# Patient Record
Sex: Male | Born: 1935 | Race: White | Hispanic: No | Marital: Married
Health system: Southern US, Community
[De-identification: ages and names within clinical notes are randomized; demographics above are authoritative.]

---

## 2005-08-14 ENCOUNTER — Ambulatory Visit: Payer: Self-pay | Admitting: Internal Medicine

## 2009-03-08 ENCOUNTER — Ambulatory Visit: Payer: Self-pay | Admitting: Internal Medicine

## 2009-03-28 ENCOUNTER — Ambulatory Visit: Payer: Self-pay | Admitting: Pain Medicine

## 2009-04-19 ENCOUNTER — Ambulatory Visit: Payer: Self-pay | Admitting: Pain Medicine

## 2009-05-03 ENCOUNTER — Ambulatory Visit: Payer: Self-pay | Admitting: Physician Assistant

## 2011-03-20 ENCOUNTER — Ambulatory Visit: Payer: Self-pay | Admitting: Ophthalmology

## 2011-04-03 ENCOUNTER — Ambulatory Visit: Payer: Self-pay | Admitting: Ophthalmology

## 2011-06-18 ENCOUNTER — Ambulatory Visit: Payer: Self-pay | Admitting: Internal Medicine

## 2011-06-22 ENCOUNTER — Ambulatory Visit: Payer: Self-pay | Admitting: Specialist

## 2011-06-28 ENCOUNTER — Ambulatory Visit: Payer: Self-pay | Admitting: Internal Medicine

## 2011-07-03 ENCOUNTER — Ambulatory Visit: Payer: Self-pay | Admitting: Internal Medicine

## 2011-07-05 ENCOUNTER — Ambulatory Visit: Payer: Self-pay | Admitting: Specialist

## 2011-07-28 ENCOUNTER — Ambulatory Visit: Payer: Self-pay | Admitting: Internal Medicine

## 2011-08-17 ENCOUNTER — Ambulatory Visit: Payer: Self-pay | Admitting: Specialist

## 2011-08-30 ENCOUNTER — Ambulatory Visit: Payer: Self-pay | Admitting: Specialist

## 2011-08-30 LAB — APTT: Activated PTT: 32.5 secs (ref 23.6–35.9)

## 2011-08-30 LAB — PLATELET COUNT: Platelet: 247 10*3/uL (ref 150–440)

## 2011-09-05 ENCOUNTER — Ambulatory Visit: Payer: Self-pay | Admitting: Internal Medicine

## 2011-09-28 ENCOUNTER — Ambulatory Visit: Payer: Self-pay | Admitting: Internal Medicine

## 2011-10-01 LAB — CBC CANCER CENTER
Basophil #: 0 x10 3/mm (ref 0.0–0.1)
Basophil %: 0.3 %
Eosinophil #: 0.2 x10 3/mm (ref 0.0–0.7)
Eosinophil %: 3.4 %
HGB: 11.9 g/dL — ABNORMAL LOW (ref 13.0–18.0)
Lymphocyte #: 0.8 x10 3/mm — ABNORMAL LOW (ref 1.0–3.6)
MCH: 27.8 pg (ref 26.0–34.0)
MCHC: 32.9 g/dL (ref 32.0–36.0)
MCV: 84 fL (ref 80–100)
Monocyte #: 0.8 x10 3/mm — ABNORMAL HIGH (ref 0.0–0.7)
Monocyte %: 13.9 %
Neutrophil #: 3.8 x10 3/mm (ref 1.4–6.5)
Neutrophil %: 68.8 %
Platelet: 317 x10 3/mm (ref 150–440)
RBC: 4.27 10*6/uL — ABNORMAL LOW (ref 4.40–5.90)
WBC: 5.6 x10 3/mm (ref 3.8–10.6)

## 2011-10-08 LAB — CBC CANCER CENTER
Basophil %: 0.3 %
Eosinophil %: 2.1 %
HCT: 36.3 % — ABNORMAL LOW (ref 40.0–52.0)
Lymphocyte %: 8 %
MCH: 28 pg (ref 26.0–34.0)
MCV: 84 fL (ref 80–100)
Monocyte #: 0.7 x10 3/mm (ref 0.0–0.7)
Neutrophil #: 4.8 x10 3/mm (ref 1.4–6.5)
Neutrophil %: 77.6 %
Platelet: 292 x10 3/mm (ref 150–440)
RBC: 4.34 10*6/uL — ABNORMAL LOW (ref 4.40–5.90)
WBC: 6.2 x10 3/mm (ref 3.8–10.6)

## 2011-10-22 LAB — CBC CANCER CENTER
Basophil #: 0 x10 3/mm (ref 0.0–0.1)
Eosinophil #: 0.1 x10 3/mm (ref 0.0–0.7)
Eosinophil %: 2.4 %
HCT: 36.4 % — ABNORMAL LOW (ref 40.0–52.0)
HGB: 12 g/dL — ABNORMAL LOW (ref 13.0–18.0)
Lymphocyte #: 0.3 x10 3/mm — ABNORMAL LOW (ref 1.0–3.6)
Lymphocyte %: 4.9 %
MCH: 27.9 pg (ref 26.0–34.0)
MCV: 85 fL (ref 80–100)
Monocyte #: 0.8 x10 3/mm — ABNORMAL HIGH (ref 0.0–0.7)
Neutrophil %: 79 %
Platelet: 259 x10 3/mm (ref 150–440)
RBC: 4.29 10*6/uL — ABNORMAL LOW (ref 4.40–5.90)
WBC: 5.7 x10 3/mm (ref 3.8–10.6)

## 2011-10-26 ENCOUNTER — Ambulatory Visit: Payer: Self-pay | Admitting: Internal Medicine

## 2011-10-29 LAB — CBC CANCER CENTER
Basophil #: 0 x10 3/mm (ref 0.0–0.1)
Eosinophil #: 0.3 x10 3/mm (ref 0.0–0.7)
Eosinophil %: 5.9 %
Lymphocyte #: 0.3 x10 3/mm — ABNORMAL LOW (ref 1.0–3.6)
Lymphocyte %: 5.2 %
MCH: 27.7 pg (ref 26.0–34.0)
MCHC: 32.3 g/dL (ref 32.0–36.0)
MCV: 86 fL (ref 80–100)
Monocyte %: 16.1 %
Neutrophil #: 3.6 x10 3/mm (ref 1.4–6.5)
Neutrophil %: 72.3 %
Platelet: 227 x10 3/mm (ref 150–440)
RBC: 4.29 10*6/uL — ABNORMAL LOW (ref 4.40–5.90)
RDW: 16.2 % — ABNORMAL HIGH (ref 11.5–14.5)

## 2011-11-05 LAB — CBC CANCER CENTER
Basophil #: 0 x10 3/mm (ref 0.0–0.1)
Eosinophil #: 0.2 x10 3/mm (ref 0.0–0.7)
Lymphocyte %: 5.2 %
MCH: 28.5 pg (ref 26.0–34.0)
MCHC: 33.3 g/dL (ref 32.0–36.0)
Neutrophil #: 2.6 x10 3/mm (ref 1.4–6.5)
Neutrophil %: 68.4 %
Platelet: 221 x10 3/mm (ref 150–440)
RDW: 16.5 % — ABNORMAL HIGH (ref 11.5–14.5)
WBC: 3.8 x10 3/mm (ref 3.8–10.6)

## 2011-11-26 ENCOUNTER — Ambulatory Visit: Payer: Self-pay | Admitting: Internal Medicine

## 2011-12-17 LAB — BASIC METABOLIC PANEL
Anion Gap: 7 (ref 7–16)
Calcium, Total: 9 mg/dL (ref 8.5–10.1)
Co2: 32 mmol/L (ref 21–32)
EGFR (African American): >60
EGFR (Non-African Amer.): 59 — ABNORMAL LOW
Glucose: 200 mg/dL — ABNORMAL HIGH (ref 65–99)
Osmolality: 285 (ref 275–301)
Potassium: 4.5 mmol/L (ref 3.5–5.1)

## 2011-12-17 LAB — CBC CANCER CENTER
Basophil #: 0.1 x10 3/mm (ref 0.0–0.1)
Basophil %: 0.5 %
Eosinophil #: 0 x10 3/mm (ref 0.0–0.7)
Eosinophil %: 0.2 %
Lymphocyte %: 0.8 %
MCH: 27.4 pg (ref 26.0–34.0)
MCHC: 31.7 g/dL — ABNORMAL LOW (ref 32.0–36.0)
Monocyte #: 0.4 x10 3/mm (ref 0.2–1.0)
Monocyte %: 4.4 %
Neutrophil #: 9.4 x10 3/mm — ABNORMAL HIGH (ref 1.4–6.5)
Neutrophil %: 94.1 %
RBC: 4.78 10*6/uL (ref 4.40–5.90)
RDW: 16.3 % — ABNORMAL HIGH (ref 11.5–14.5)

## 2011-12-17 LAB — HEPATIC FUNCTION PANEL A (ARMC)
Alkaline Phosphatase: 85 U/L (ref 50–136)
Bilirubin, Direct: 0.2 mg/dL (ref 0.00–0.20)
Bilirubin,Total: 0.6 mg/dL (ref 0.2–1.0)
SGOT(AST): 22 U/L (ref 15–37)
Total Protein: 6.8 g/dL (ref 6.4–8.2)

## 2011-12-25 ENCOUNTER — Inpatient Hospital Stay: Payer: Self-pay | Admitting: Internal Medicine

## 2011-12-25 LAB — CBC WITH DIFFERENTIAL/PLATELET
Basophil #: 0 10*3/uL (ref 0.0–0.1)
Eosinophil #: 0 10*3/uL (ref 0.0–0.7)
HGB: 13.1 g/dL (ref 13.0–18.0)
Lymphocyte #: 0.2 10*3/uL — ABNORMAL LOW (ref 1.0–3.6)
MCH: 28.4 pg (ref 26.0–34.0)
MCHC: 33 g/dL (ref 32.0–36.0)
MCV: 86 fL (ref 80–100)
Monocyte #: 0.4 x10 3/mm (ref 0.2–1.0)
Monocyte %: 4.4 %
Neutrophil #: 8.8 10*3/uL — ABNORMAL HIGH (ref 1.4–6.5)
Platelet: 164 10*3/uL (ref 150–440)
RDW: 16.8 % — ABNORMAL HIGH (ref 11.5–14.5)

## 2011-12-25 LAB — COMPREHENSIVE METABOLIC PANEL
Albumin: 3 g/dL — ABNORMAL LOW (ref 3.4–5.0)
Alkaline Phosphatase: 66 U/L (ref 50–136)
Anion Gap: 3 — ABNORMAL LOW (ref 7–16)
Bilirubin,Total: 0.6 mg/dL (ref 0.2–1.0)
Co2: 34 mmol/L — ABNORMAL HIGH (ref 21–32)
EGFR (African American): >60
EGFR (Non-African Amer.): 57 — ABNORMAL LOW
Glucose: 138 mg/dL — ABNORMAL HIGH (ref 65–99)
Sodium: 138 mmol/L (ref 136–145)
Total Protein: 6.6 g/dL (ref 6.4–8.2)

## 2011-12-26 ENCOUNTER — Ambulatory Visit: Payer: Self-pay | Admitting: Internal Medicine

## 2011-12-26 LAB — BASIC METABOLIC PANEL
BUN: 29 mg/dL — ABNORMAL HIGH (ref 7–18)
Creatinine: 1 mg/dL (ref 0.60–1.30)
EGFR (African American): >60
EGFR (Non-African Amer.): >60
Glucose: 187 mg/dL — ABNORMAL HIGH (ref 65–99)
Osmolality: 285 (ref 275–301)
Potassium: 4.1 mmol/L (ref 3.5–5.1)
Sodium: 137 mmol/L (ref 136–145)

## 2011-12-26 LAB — CBC WITH DIFFERENTIAL/PLATELET
Basophil #: 0 10*3/uL (ref 0.0–0.1)
Basophil %: 0.2 %
Eosinophil #: 0 10*3/uL (ref 0.0–0.7)
Eosinophil %: 0 %
HCT: 37.4 % — ABNORMAL LOW (ref 40.0–52.0)
HGB: 12.4 g/dL — ABNORMAL LOW (ref 13.0–18.0)
Lymphocyte #: 0.1 10*3/uL — ABNORMAL LOW (ref 1.0–3.6)
Lymphocyte %: 0.9 %
MCH: 28.1 pg (ref 26.0–34.0)
MCHC: 33 g/dL (ref 32.0–36.0)
MCV: 85 fL (ref 80–100)
Neutrophil #: 8.2 10*3/uL — ABNORMAL HIGH (ref 1.4–6.5)
Neutrophil %: 97.5 %
Platelet: 154 10*3/uL (ref 150–440)
RBC: 4.4 10*6/uL (ref 4.40–5.90)
RDW: 16.6 % — ABNORMAL HIGH (ref 11.5–14.5)

## 2011-12-26 LAB — TROPONIN I: Troponin-I: <0.02 ng/mL

## 2011-12-28 LAB — VANCOMYCIN, TROUGH: Vancomycin, Trough: 14 ug/mL (ref 10–20)

## 2011-12-29 LAB — CBC WITH DIFFERENTIAL/PLATELET
Basophil %: 0.2 %
Eosinophil #: 0 10*3/uL (ref 0.0–0.7)
HCT: 36.2 % — ABNORMAL LOW (ref 40.0–52.0)
Lymphocyte #: 0.1 10*3/uL — ABNORMAL LOW (ref 1.0–3.6)
MCHC: 33.3 g/dL (ref 32.0–36.0)
MCV: 85 fL (ref 80–100)
Monocyte #: 0.4 x10 3/mm (ref 0.2–1.0)
Monocyte %: 3.9 %
Neutrophil #: 11 10*3/uL — ABNORMAL HIGH (ref 1.4–6.5)
Neutrophil %: 95.3 %
Platelet: 174 10*3/uL (ref 150–440)
RDW: 16.3 % — ABNORMAL HIGH (ref 11.5–14.5)
WBC: 11.5 10*3/uL — ABNORMAL HIGH (ref 3.8–10.6)

## 2011-12-29 LAB — COMPREHENSIVE METABOLIC PANEL
Anion Gap: 6 — ABNORMAL LOW (ref 7–16)
BUN: 32 mg/dL — ABNORMAL HIGH (ref 7–18)
Calcium, Total: 8 mg/dL — ABNORMAL LOW (ref 8.5–10.1)
Chloride: 99 mmol/L (ref 98–107)
Co2: 35 mmol/L — ABNORMAL HIGH (ref 21–32)
Creatinine: 1.02 mg/dL (ref 0.60–1.30)
Glucose: 195 mg/dL — ABNORMAL HIGH (ref 65–99)
SGPT (ALT): 65 U/L
Sodium: 140 mmol/L (ref 136–145)
Total Protein: 5.7 g/dL — ABNORMAL LOW (ref 6.4–8.2)

## 2011-12-31 LAB — CBC WITH DIFFERENTIAL/PLATELET
Basophil #: 0 10*3/uL (ref 0.0–0.1)
Eosinophil %: 0 %
HGB: 11.4 g/dL — ABNORMAL LOW (ref 13.0–18.0)
Lymphocyte #: 0 10*3/uL — ABNORMAL LOW (ref 1.0–3.6)
Lymphocyte %: 0.6 %
MCH: 28.2 pg (ref 26.0–34.0)
Monocyte #: 0.3 x10 3/mm (ref 0.2–1.0)
Monocyte %: 4.4 %
Neutrophil #: 6.9 10*3/uL — ABNORMAL HIGH (ref 1.4–6.5)
Platelet: 139 10*3/uL — ABNORMAL LOW (ref 150–440)
RBC: 4.05 10*6/uL — ABNORMAL LOW (ref 4.40–5.90)
RDW: 16.1 % — ABNORMAL HIGH (ref 11.5–14.5)
WBC: 7.3 10*3/uL (ref 3.8–10.6)

## 2011-12-31 LAB — COMPREHENSIVE METABOLIC PANEL
Albumin: 2.3 g/dL — ABNORMAL LOW (ref 3.4–5.0)
Alkaline Phosphatase: 71 U/L (ref 50–136)
Anion Gap: 7 (ref 7–16)
BUN: 30 mg/dL — ABNORMAL HIGH (ref 7–18)
Bilirubin,Total: 0.4 mg/dL (ref 0.2–1.0)
Co2: 35 mmol/L — ABNORMAL HIGH (ref 21–32)
Creatinine: 1.12 mg/dL (ref 0.60–1.30)
Glucose: 301 mg/dL — ABNORMAL HIGH (ref 65–99)
Potassium: 3.9 mmol/L (ref 3.5–5.1)
SGPT (ALT): 108 U/L — ABNORMAL HIGH
Total Protein: 5.2 g/dL — ABNORMAL LOW (ref 6.4–8.2)

## 2011-12-31 LAB — VANCOMYCIN, TROUGH: Vancomycin, Trough: 12 ug/mL (ref 10–20)

## 2012-01-01 LAB — CBC WITH DIFFERENTIAL/PLATELET
Basophil #: 0 10*3/uL (ref 0.0–0.1)
HCT: 35.9 % — ABNORMAL LOW (ref 40.0–52.0)
Lymphocyte #: 0 10*3/uL — ABNORMAL LOW (ref 1.0–3.6)
Lymphocyte %: 0.5 %
MCH: 28.1 pg (ref 26.0–34.0)
MCHC: 32.8 g/dL (ref 32.0–36.0)
Monocyte %: 3.7 %
Neutrophil #: 8.6 10*3/uL — ABNORMAL HIGH (ref 1.4–6.5)
Neutrophil %: 95.4 %
Platelet: 144 10*3/uL — ABNORMAL LOW (ref 150–440)
WBC: 9 10*3/uL (ref 3.8–10.6)

## 2012-01-01 LAB — COMPREHENSIVE METABOLIC PANEL
Albumin: 2.4 g/dL — ABNORMAL LOW (ref 3.4–5.0)
Alkaline Phosphatase: 65 U/L (ref 50–136)
BUN: 28 mg/dL — ABNORMAL HIGH (ref 7–18)
Bilirubin,Total: 0.4 mg/dL (ref 0.2–1.0)
Calcium, Total: 8 mg/dL — ABNORMAL LOW (ref 8.5–10.1)
Chloride: 98 mmol/L (ref 98–107)
Co2: 37 mmol/L — ABNORMAL HIGH (ref 21–32)
Creatinine: 0.89 mg/dL (ref 0.60–1.30)
EGFR (African American): >60
EGFR (Non-African Amer.): >60
Glucose: 249 mg/dL — ABNORMAL HIGH (ref 65–99)
Osmolality: 293 (ref 275–301)
Potassium: 3.9 mmol/L (ref 3.5–5.1)
SGOT(AST): 55 U/L — ABNORMAL HIGH (ref 15–37)
SGPT (ALT): 136 U/L — ABNORMAL HIGH
Sodium: 140 mmol/L (ref 136–145)
Total Protein: 5.3 g/dL — ABNORMAL LOW (ref 6.4–8.2)

## 2012-01-02 LAB — COMPREHENSIVE METABOLIC PANEL
Albumin: 2.4 g/dL — ABNORMAL LOW (ref 3.4–5.0)
Alkaline Phosphatase: 76 U/L (ref 50–136)
Calcium, Total: 7.9 mg/dL — ABNORMAL LOW (ref 8.5–10.1)
Co2: 37 mmol/L — ABNORMAL HIGH (ref 21–32)
Creatinine: 1.01 mg/dL (ref 0.60–1.30)
Glucose: 361 mg/dL — ABNORMAL HIGH (ref 65–99)
SGOT(AST): 58 U/L — ABNORMAL HIGH (ref 15–37)
Sodium: 139 mmol/L (ref 136–145)

## 2012-01-02 LAB — CBC WITH DIFFERENTIAL/PLATELET
Basophil #: 0 10*3/uL (ref 0.0–0.1)
Basophil %: 0.1 %
Eosinophil %: 0 %
HCT: 35.5 % — ABNORMAL LOW (ref 40.0–52.0)
HGB: 11.7 g/dL — ABNORMAL LOW (ref 13.0–18.0)
Lymphocyte #: 0 10*3/uL — ABNORMAL LOW (ref 1.0–3.6)
Lymphocyte %: 0.5 %
MCHC: 32.9 g/dL (ref 32.0–36.0)
Monocyte #: 0.3 x10 3/mm (ref 0.2–1.0)
Neutrophil #: 7.2 10*3/uL — ABNORMAL HIGH (ref 1.4–6.5)
Neutrophil %: 95.7 %
RBC: 4.11 10*6/uL — ABNORMAL LOW (ref 4.40–5.90)
RDW: 16.5 % — ABNORMAL HIGH (ref 11.5–14.5)
WBC: 7.5 10*3/uL (ref 3.8–10.6)

## 2012-01-02 LAB — VANCOMYCIN, TROUGH: Vancomycin, Trough: 19 ug/mL (ref 10–20)

## 2012-01-03 LAB — CBC WITH DIFFERENTIAL/PLATELET
Basophil #: 0 10*3/uL (ref 0.0–0.1)
Basophil %: 0.1 %
Eosinophil #: 0 10*3/uL (ref 0.0–0.7)
HCT: 37.6 % — ABNORMAL LOW (ref 40.0–52.0)
Lymphocyte #: 0 10*3/uL — ABNORMAL LOW (ref 1.0–3.6)
MCH: 28.5 pg (ref 26.0–34.0)
MCHC: 32.9 g/dL (ref 32.0–36.0)
MCV: 87 fL (ref 80–100)
Monocyte #: 0.3 x10 3/mm (ref 0.2–1.0)
Platelet: 144 10*3/uL — ABNORMAL LOW (ref 150–440)
RBC: 4.34 10*6/uL — ABNORMAL LOW (ref 4.40–5.90)
RDW: 16.2 % — ABNORMAL HIGH (ref 11.5–14.5)

## 2012-01-03 LAB — COMPREHENSIVE METABOLIC PANEL
Albumin: 2.7 g/dL — ABNORMAL LOW (ref 3.4–5.0)
Anion Gap: 5 — ABNORMAL LOW (ref 7–16)
BUN: 30 mg/dL — ABNORMAL HIGH (ref 7–18)
Bilirubin,Total: 0.5 mg/dL (ref 0.2–1.0)
Chloride: 94 mmol/L — ABNORMAL LOW (ref 98–107)
Co2: 38 mmol/L — ABNORMAL HIGH (ref 21–32)
EGFR (African American): >60
EGFR (Non-African Amer.): >60
Glucose: 391 mg/dL — ABNORMAL HIGH (ref 65–99)
SGOT(AST): 51 U/L — ABNORMAL HIGH (ref 15–37)
SGPT (ALT): 169 U/L — ABNORMAL HIGH
Sodium: 137 mmol/L (ref 136–145)
Total Protein: 5.8 g/dL — ABNORMAL LOW (ref 6.4–8.2)

## 2012-01-04 LAB — COMPREHENSIVE METABOLIC PANEL
Alkaline Phosphatase: 84 U/L (ref 50–136)
Anion Gap: 7 (ref 7–16)
BUN: 35 mg/dL — ABNORMAL HIGH (ref 7–18)
Chloride: 95 mmol/L — ABNORMAL LOW (ref 98–107)
Creatinine: 1.03 mg/dL (ref 0.60–1.30)
EGFR (African American): >60
EGFR (Non-African Amer.): >60
Glucose: 394 mg/dL — ABNORMAL HIGH (ref 65–99)
SGOT(AST): 43 U/L — ABNORMAL HIGH (ref 15–37)
SGPT (ALT): 176 U/L — ABNORMAL HIGH
Total Protein: 5.9 g/dL — ABNORMAL LOW (ref 6.4–8.2)

## 2012-01-04 LAB — CBC WITH DIFFERENTIAL/PLATELET
Basophil #: 0 10*3/uL (ref 0.0–0.1)
Basophil %: 0.2 %
Eosinophil #: 0 10*3/uL (ref 0.0–0.7)
Eosinophil %: 0 %
Lymphocyte #: 0 10*3/uL — ABNORMAL LOW (ref 1.0–3.6)
Monocyte #: 0.4 x10 3/mm (ref 0.2–1.0)
Monocyte %: 3.5 %
Neutrophil #: 9.6 10*3/uL — ABNORMAL HIGH (ref 1.4–6.5)
Neutrophil %: 96 %
Platelet: 160 10*3/uL (ref 150–440)
RDW: 16.6 % — ABNORMAL HIGH (ref 11.5–14.5)
WBC: 10 10*3/uL (ref 3.8–10.6)

## 2012-01-04 LAB — LACTATE DEHYDROGENASE: LDH: 484 U/L — ABNORMAL HIGH (ref 87–241)

## 2012-01-05 LAB — CBC WITH DIFFERENTIAL/PLATELET
Basophil #: 0 10*3/uL (ref 0.0–0.1)
Basophil %: 0.1 %
Eosinophil #: 0 10*3/uL (ref 0.0–0.7)
HCT: 37.1 % — ABNORMAL LOW (ref 40.0–52.0)
HGB: 12.1 g/dL — ABNORMAL LOW (ref 13.0–18.0)
Lymphocyte #: 0 10*3/uL — ABNORMAL LOW (ref 1.0–3.6)
Lymphocyte %: 0.3 %
MCHC: 32.5 g/dL (ref 32.0–36.0)
MCV: 87 fL (ref 80–100)
Monocyte #: 0.4 x10 3/mm (ref 0.2–1.0)
Neutrophil #: 8.7 10*3/uL — ABNORMAL HIGH (ref 1.4–6.5)
Platelet: 129 10*3/uL — ABNORMAL LOW (ref 150–440)
RDW: 16.5 % — ABNORMAL HIGH (ref 11.5–14.5)

## 2012-01-05 LAB — COMPREHENSIVE METABOLIC PANEL
Albumin: 2.6 g/dL — ABNORMAL LOW (ref 3.4–5.0)
BUN: 30 mg/dL — ABNORMAL HIGH (ref 7–18)
Calcium, Total: 8.1 mg/dL — ABNORMAL LOW (ref 8.5–10.1)
Chloride: 100 mmol/L (ref 98–107)
Co2: 37 mmol/L — ABNORMAL HIGH (ref 21–32)
Creatinine: 1.06 mg/dL (ref 0.60–1.30)
EGFR (African American): >60
EGFR (Non-African Amer.): >60
Glucose: 355 mg/dL — ABNORMAL HIGH (ref 65–99)
SGPT (ALT): 146 U/L — ABNORMAL HIGH
Sodium: 144 mmol/L (ref 136–145)
Total Protein: 5.5 g/dL — ABNORMAL LOW (ref 6.4–8.2)

## 2012-01-06 LAB — CBC WITH DIFFERENTIAL/PLATELET
Basophil #: 0 10*3/uL (ref 0.0–0.1)
Eosinophil #: 0 10*3/uL (ref 0.0–0.7)
Eosinophil %: 0 %
HCT: 39.5 % — ABNORMAL LOW (ref 40.0–52.0)
Lymphocyte #: 0 10*3/uL — ABNORMAL LOW (ref 1.0–3.6)
Lymphocyte %: 0.4 %
MCH: 28.2 pg (ref 26.0–34.0)
MCV: 88 fL (ref 80–100)
Platelet: 132 10*3/uL — ABNORMAL LOW (ref 150–440)
RBC: 4.49 10*6/uL (ref 4.40–5.90)
RDW: 16.7 % — ABNORMAL HIGH (ref 11.5–14.5)
WBC: 12.3 10*3/uL — ABNORMAL HIGH (ref 3.8–10.6)

## 2012-01-06 LAB — HEPATIC FUNCTION PANEL A (ARMC)
Albumin: 2.7 g/dL — ABNORMAL LOW (ref 3.4–5.0)
Bilirubin, Direct: <0.1 mg/dL (ref 0.00–0.20)
Bilirubin,Total: 0.5 mg/dL (ref 0.2–1.0)
Total Protein: 5.7 g/dL — ABNORMAL LOW (ref 6.4–8.2)

## 2012-01-06 LAB — BASIC METABOLIC PANEL
Calcium, Total: 8.2 mg/dL — ABNORMAL LOW (ref 8.5–10.1)
Chloride: 102 mmol/L (ref 98–107)
Co2: 31 mmol/L (ref 21–32)
Creatinine: 0.92 mg/dL (ref 0.60–1.30)
Osmolality: 307 (ref 275–301)
Potassium: 4.2 mmol/L (ref 3.5–5.1)
Sodium: 142 mmol/L (ref 136–145)

## 2012-01-07 LAB — BASIC METABOLIC PANEL
Anion Gap: 4 — ABNORMAL LOW (ref 7–16)
BUN: 36 mg/dL — ABNORMAL HIGH (ref 7–18)
Calcium, Total: 8.5 mg/dL (ref 8.5–10.1)
EGFR (African American): >60
Glucose: 232 mg/dL — ABNORMAL HIGH (ref 65–99)
Osmolality: 297 (ref 275–301)
Potassium: 4.4 mmol/L (ref 3.5–5.1)

## 2012-01-07 LAB — CBC WITH DIFFERENTIAL/PLATELET
Basophil #: 0 10*3/uL (ref 0.0–0.1)
Basophil %: 0.4 %
HCT: 37.2 % — ABNORMAL LOW (ref 40.0–52.0)
HGB: 12.1 g/dL — ABNORMAL LOW (ref 13.0–18.0)
Lymphocyte #: 0.1 10*3/uL — ABNORMAL LOW (ref 1.0–3.6)
Lymphocyte %: 0.7 %
MCH: 28.3 pg (ref 26.0–34.0)
MCHC: 32.4 g/dL (ref 32.0–36.0)
MCV: 87 fL (ref 80–100)
Monocyte #: 0.3 x10 3/mm (ref 0.2–1.0)
Neutrophil #: 11.7 10*3/uL — ABNORMAL HIGH (ref 1.4–6.5)
Neutrophil %: 96.4 %
Platelet: 112 10*3/uL — ABNORMAL LOW (ref 150–440)
RDW: 16.8 % — ABNORMAL HIGH (ref 11.5–14.5)

## 2012-01-08 LAB — COMPREHENSIVE METABOLIC PANEL
Anion Gap: 5 — ABNORMAL LOW (ref 7–16)
BUN: 37 mg/dL — ABNORMAL HIGH (ref 7–18)
Co2: 35 mmol/L — ABNORMAL HIGH (ref 21–32)
EGFR (African American): >60
EGFR (Non-African Amer.): >60
Glucose: 244 mg/dL — ABNORMAL HIGH (ref 65–99)
Osmolality: 296 (ref 275–301)
Potassium: 4.2 mmol/L (ref 3.5–5.1)
SGOT(AST): 64 U/L — ABNORMAL HIGH (ref 15–37)
SGPT (ALT): 204 U/L — ABNORMAL HIGH
Sodium: 140 mmol/L (ref 136–145)

## 2012-01-08 LAB — CBC WITH DIFFERENTIAL/PLATELET
Basophil %: 0.1 %
Eosinophil #: 0 10*3/uL (ref 0.0–0.7)
HCT: 35.1 % — ABNORMAL LOW (ref 40.0–52.0)
HGB: 11.4 g/dL — ABNORMAL LOW (ref 13.0–18.0)
Lymphocyte #: 0 10*3/uL — ABNORMAL LOW (ref 1.0–3.6)
Lymphocyte %: 0.4 %
MCV: 87 fL (ref 80–100)
Monocyte #: 0.5 x10 3/mm (ref 0.2–1.0)
Monocyte %: 5.2 %
Neutrophil #: 9.8 10*3/uL — ABNORMAL HIGH (ref 1.4–6.5)
Neutrophil %: 94.3 %
Platelet: 91 10*3/uL — ABNORMAL LOW (ref 150–440)
RDW: 16.7 % — ABNORMAL HIGH (ref 11.5–14.5)
WBC: 10.4 10*3/uL (ref 3.8–10.6)

## 2012-04-14 ENCOUNTER — Ambulatory Visit: Payer: Self-pay | Admitting: Radiation Oncology

## 2012-04-14 LAB — CREATININE, SERUM
Creatinine: 1.03 mg/dL (ref 0.60–1.30)
EGFR (African American): >60
EGFR (Non-African Amer.): >60

## 2012-04-27 ENCOUNTER — Ambulatory Visit: Payer: Self-pay | Admitting: Radiation Oncology

## 2012-04-29 ENCOUNTER — Ambulatory Visit: Payer: Self-pay | Admitting: Pain Medicine

## 2012-05-19 ENCOUNTER — Ambulatory Visit: Payer: Self-pay | Admitting: Pain Medicine

## 2012-06-16 ENCOUNTER — Ambulatory Visit: Payer: Self-pay | Admitting: Internal Medicine

## 2012-06-16 LAB — HEPATIC FUNCTION PANEL A (ARMC)
Albumin: 3.7 g/dL (ref 3.4–5.0)
Bilirubin,Total: 0.7 mg/dL (ref 0.2–1.0)
SGOT(AST): 29 U/L (ref 15–37)
Total Protein: 6.7 g/dL (ref 6.4–8.2)

## 2012-06-16 LAB — CBC CANCER CENTER
Basophil %: 0.5 %
Eosinophil #: 0 x10 3/mm (ref 0.0–0.7)
HGB: 12.4 g/dL — ABNORMAL LOW (ref 13.0–18.0)
Lymphocyte #: 0.5 x10 3/mm — ABNORMAL LOW (ref 1.0–3.6)
Lymphocyte %: 6.4 %
MCH: 28.8 pg (ref 26.0–34.0)
MCV: 91 fL (ref 80–100)
Monocyte %: 11 %
Neutrophil #: 6.1 x10 3/mm (ref 1.4–6.5)
Neutrophil %: 81.8 %
RBC: 4.3 10*6/uL — ABNORMAL LOW (ref 4.40–5.90)
WBC: 7.5 x10 3/mm (ref 3.8–10.6)

## 2012-06-16 LAB — CREATININE, SERUM: Creatinine: 1.12 mg/dL (ref 0.60–1.30)

## 2012-06-27 ENCOUNTER — Ambulatory Visit: Payer: Self-pay | Admitting: Internal Medicine

## 2012-06-30 ENCOUNTER — Ambulatory Visit: Payer: Self-pay | Admitting: Internal Medicine

## 2012-07-01 ENCOUNTER — Ambulatory Visit: Payer: Self-pay | Admitting: Pain Medicine

## 2012-07-16 ENCOUNTER — Ambulatory Visit: Payer: Self-pay | Admitting: Pain Medicine

## 2012-07-17 ENCOUNTER — Inpatient Hospital Stay: Payer: Self-pay | Admitting: Internal Medicine

## 2012-07-17 LAB — CK TOTAL AND CKMB (NOT AT ARMC): CK, Total: 31 U/L — ABNORMAL LOW (ref 35–232)

## 2012-07-17 LAB — CBC WITH DIFFERENTIAL/PLATELET
Basophil %: 0.6 %
Eosinophil %: 0.1 %
HCT: 39.9 % — ABNORMAL LOW (ref 40.0–52.0)
HGB: 13.2 g/dL (ref 13.0–18.0)
MCHC: 33 g/dL (ref 32.0–36.0)
MCV: 91 fL (ref 80–100)
Monocyte #: 0.8 x10 3/mm (ref 0.2–1.0)
Monocyte %: 6.4 %
Neutrophil #: 11.9 10*3/uL — ABNORMAL HIGH (ref 1.4–6.5)
Neutrophil %: 92.1 %
RDW: 17.1 % — ABNORMAL HIGH (ref 11.5–14.5)
WBC: 12.9 10*3/uL — ABNORMAL HIGH (ref 3.8–10.6)

## 2012-07-17 LAB — COMPREHENSIVE METABOLIC PANEL
Alkaline Phosphatase: 79 U/L (ref 50–136)
Anion Gap: 2 — ABNORMAL LOW (ref 7–16)
BUN: 32 mg/dL — ABNORMAL HIGH (ref 7–18)
Calcium, Total: 9.5 mg/dL (ref 8.5–10.1)
Chloride: 100 mmol/L (ref 98–107)
Creatinine: 1.07 mg/dL (ref 0.60–1.30)
EGFR (African American): >60
Potassium: 4.3 mmol/L (ref 3.5–5.1)
SGOT(AST): 33 U/L (ref 15–37)
SGPT (ALT): 44 U/L (ref 12–78)
Total Protein: 7 g/dL (ref 6.4–8.2)

## 2012-07-17 LAB — URINALYSIS, COMPLETE
Bacteria: NONE SEEN
Blood: NEGATIVE
Glucose,UR: 50 mg/dL (ref 0–75)
Nitrite: NEGATIVE
Protein: 30
Specific Gravity: 1.018 (ref 1.003–1.030)

## 2012-07-17 LAB — TROPONIN I: Troponin-I: <0.02 ng/mL

## 2012-07-17 LAB — PRO B NATRIURETIC PEPTIDE: B-Type Natriuretic Peptide: 729 pg/mL — ABNORMAL HIGH (ref 0–450)

## 2012-07-19 LAB — CBC WITH DIFFERENTIAL/PLATELET
Basophil %: 0.3 %
Eosinophil #: 0 10*3/uL (ref 0.0–0.7)
HCT: 35.3 % — ABNORMAL LOW (ref 40.0–52.0)
HGB: 11.8 g/dL — ABNORMAL LOW (ref 13.0–18.0)
Lymphocyte #: 0.1 10*3/uL — ABNORMAL LOW (ref 1.0–3.6)
Lymphocyte %: 0.9 %
MCHC: 33.6 g/dL (ref 32.0–36.0)
MCV: 91 fL (ref 80–100)
Monocyte #: 0.3 x10 3/mm (ref 0.2–1.0)
Monocyte %: 2.8 %
Neutrophil #: 9.9 10*3/uL — ABNORMAL HIGH (ref 1.4–6.5)
Neutrophil %: 96 %
Platelet: 116 10*3/uL — ABNORMAL LOW (ref 150–440)
RDW: 16.6 % — ABNORMAL HIGH (ref 11.5–14.5)
WBC: 10.4 10*3/uL (ref 3.8–10.6)

## 2012-07-19 LAB — BASIC METABOLIC PANEL
Calcium, Total: 9.3 mg/dL (ref 8.5–10.1)
Chloride: 97 mmol/L — ABNORMAL LOW (ref 98–107)
Co2: 38 mmol/L — ABNORMAL HIGH (ref 21–32)
EGFR (Non-African Amer.): 54 — ABNORMAL LOW
Potassium: 4.2 mmol/L (ref 3.5–5.1)
Sodium: 138 mmol/L (ref 136–145)

## 2012-07-20 LAB — PHOSPHORUS: Phosphorus: 1.7 mg/dL — ABNORMAL LOW (ref 2.5–4.9)

## 2012-07-21 LAB — BASIC METABOLIC PANEL
BUN: 40 mg/dL — ABNORMAL HIGH (ref 7–18)
Chloride: 95 mmol/L — ABNORMAL LOW (ref 98–107)
Co2: 42 mmol/L (ref 21–32)
Creatinine: 1.11 mg/dL (ref 0.60–1.30)
EGFR (African American): >60
EGFR (Non-African Amer.): >60
Osmolality: 294 (ref 275–301)
Sodium: 139 mmol/L (ref 136–145)

## 2012-07-21 LAB — CBC WITH DIFFERENTIAL/PLATELET
Basophil #: 0 10*3/uL (ref 0.0–0.1)
Basophil %: 0.1 %
Eosinophil %: 0 %
HCT: 34.6 % — ABNORMAL LOW (ref 40.0–52.0)
HGB: 12.1 g/dL — ABNORMAL LOW (ref 13.0–18.0)
Lymphocyte #: 0.1 10*3/uL — ABNORMAL LOW (ref 1.0–3.6)
Lymphocyte %: 1.2 %
MCHC: 35 g/dL (ref 32.0–36.0)
MCV: 90 fL (ref 80–100)
Monocyte %: 4.6 %
Neutrophil #: 11.1 10*3/uL — ABNORMAL HIGH (ref 1.4–6.5)
RBC: 3.83 10*6/uL — ABNORMAL LOW (ref 4.40–5.90)
WBC: 11.8 10*3/uL — ABNORMAL HIGH (ref 3.8–10.6)

## 2012-07-23 LAB — CULTURE, BLOOD (SINGLE)

## 2012-07-27 ENCOUNTER — Ambulatory Visit: Payer: Self-pay | Admitting: Internal Medicine

## 2012-07-29 ENCOUNTER — Ambulatory Visit: Payer: Self-pay | Admitting: Pain Medicine

## 2012-08-14 ENCOUNTER — Ambulatory Visit: Payer: Self-pay | Admitting: Pain Medicine

## 2012-08-26 ENCOUNTER — Ambulatory Visit: Payer: Self-pay | Admitting: Pain Medicine

## 2012-09-02 ENCOUNTER — Ambulatory Visit: Payer: Self-pay | Admitting: Pain Medicine

## 2012-09-22 ENCOUNTER — Ambulatory Visit: Payer: Self-pay | Admitting: Pain Medicine

## 2012-09-27 DEATH — deceased

## 2013-04-21 IMAGING — CT CT ASPIRATION
2 of 6 series · 14 of 32 positions shown, 20 images · non-contrast
Comparison: none

REASON FOR EXAM: left lung mass
COMMENTS:

[Series 2: soft tissue · axial · 0.80mm/px · z∈[+594,+844]mm · 10 of 62 slices shown, 16 images]
[im 6/62  soft-tissue]
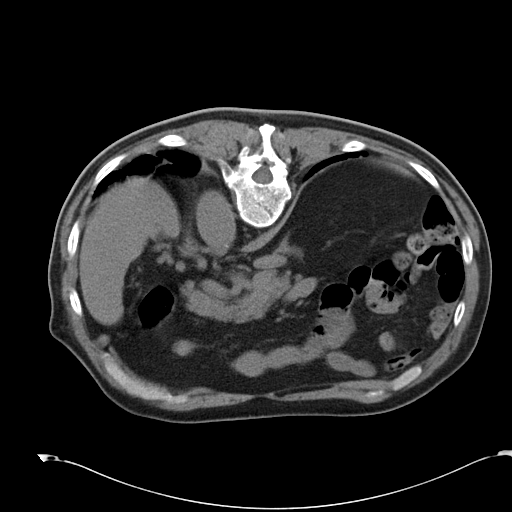
[im 6/62  bone]
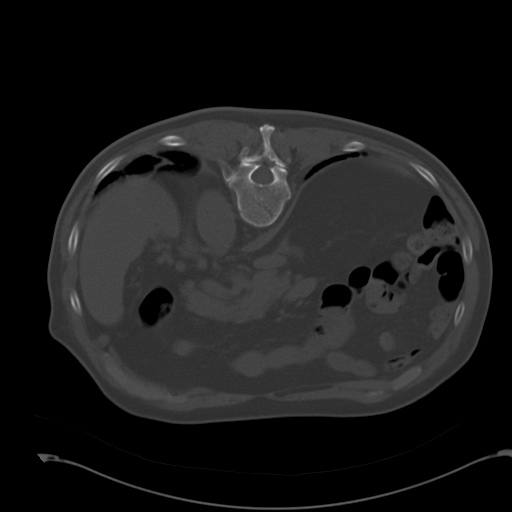
[im 12/62  soft-tissue]
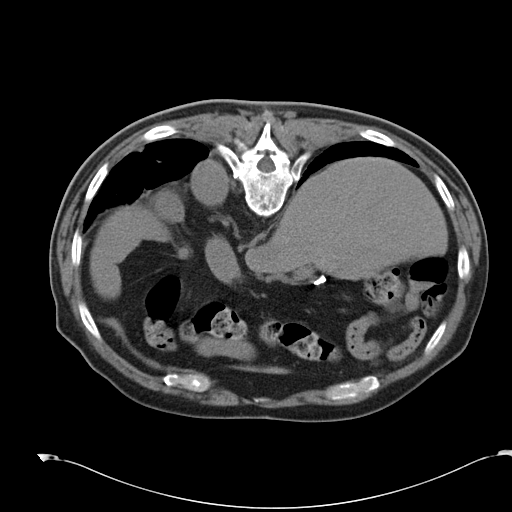
[im 17/62  soft-tissue]
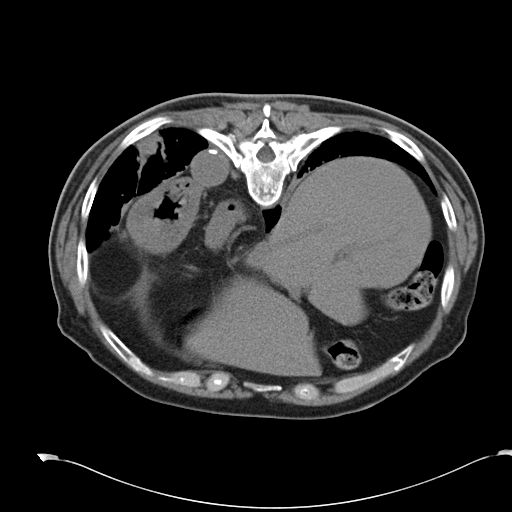
[im 23/62  soft-tissue]
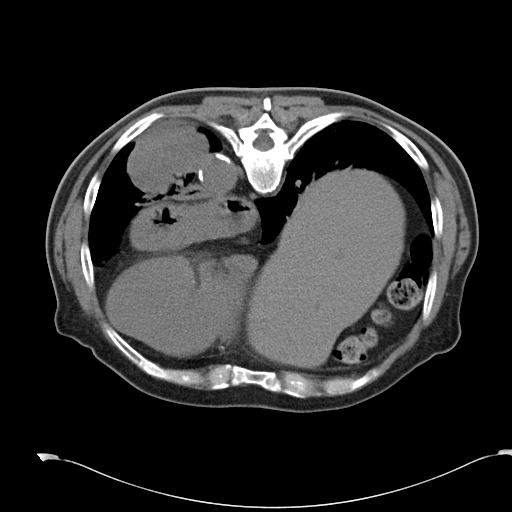
[im 28/62  soft-tissue]
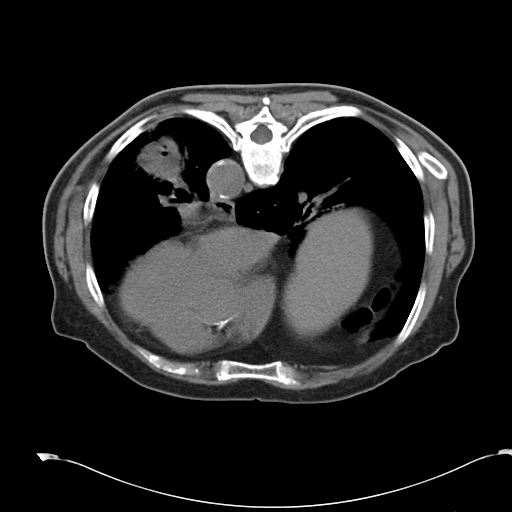
[im 34/62  soft-tissue]
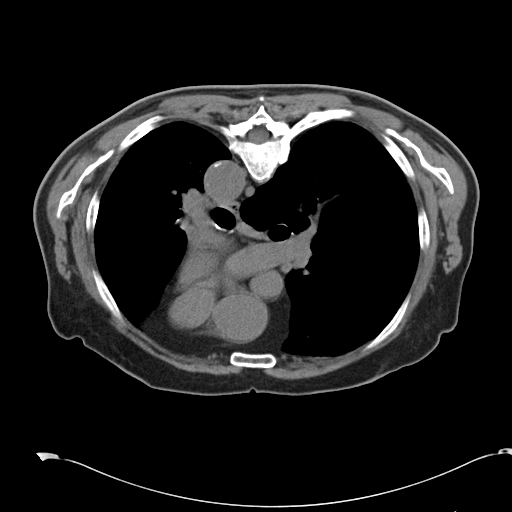
[im 39/62  soft-tissue]
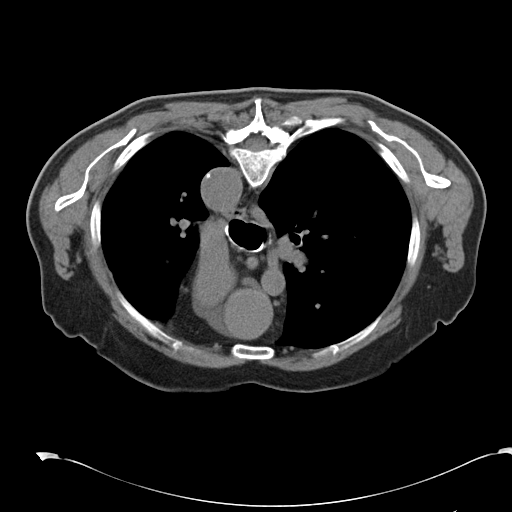
[im 39/62  lung]
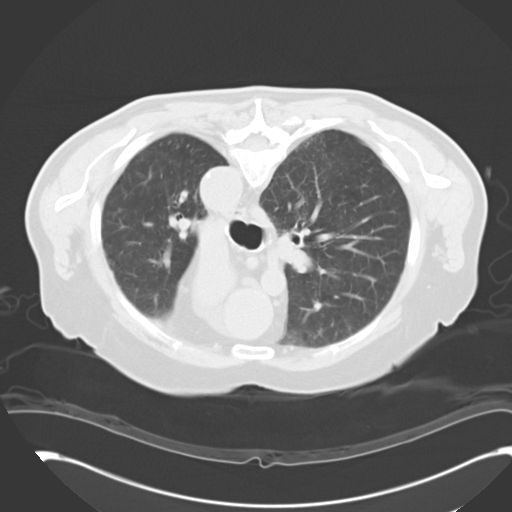
[im 45/62  soft-tissue]
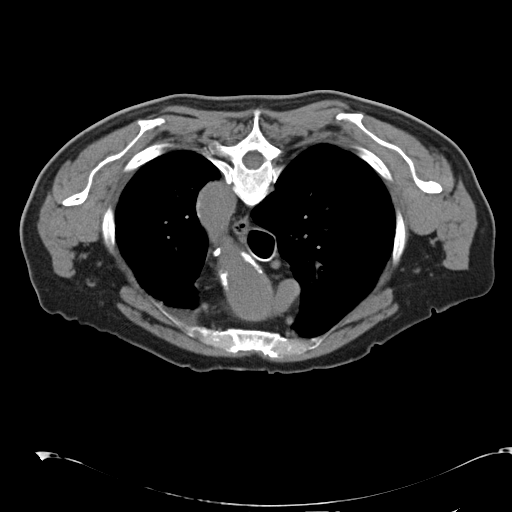
[im 45/62  lung]
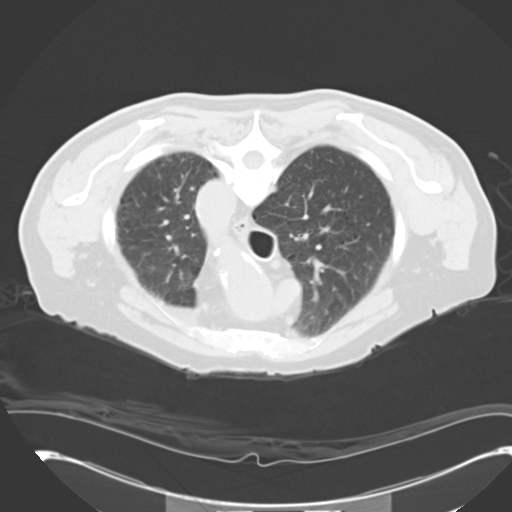
[im 50/62  soft-tissue]
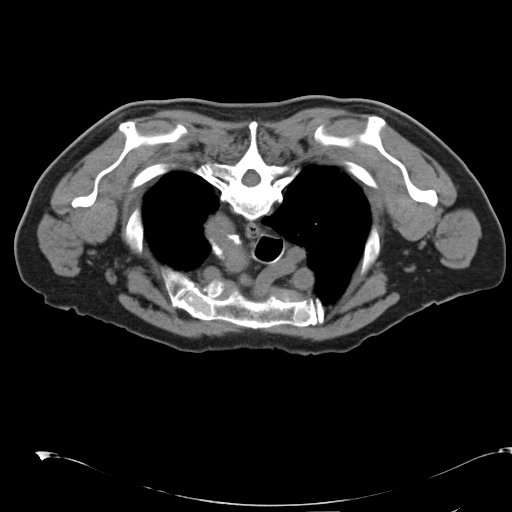
[im 50/62  lung]
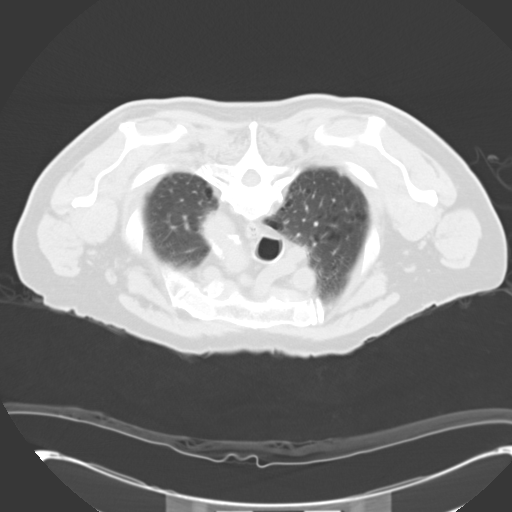
[im 50/62  bone]
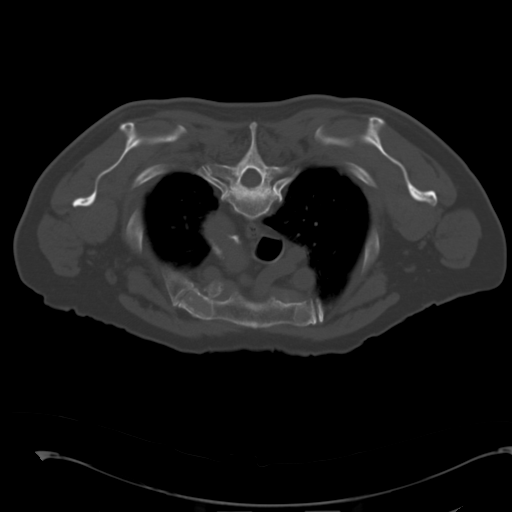
[im 56/62  soft-tissue]
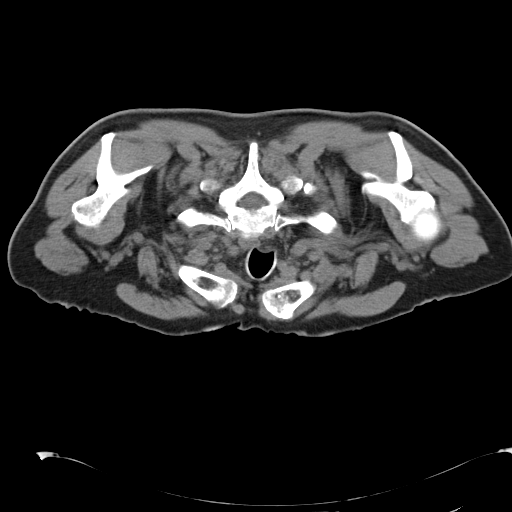
[im 56/62  lung]
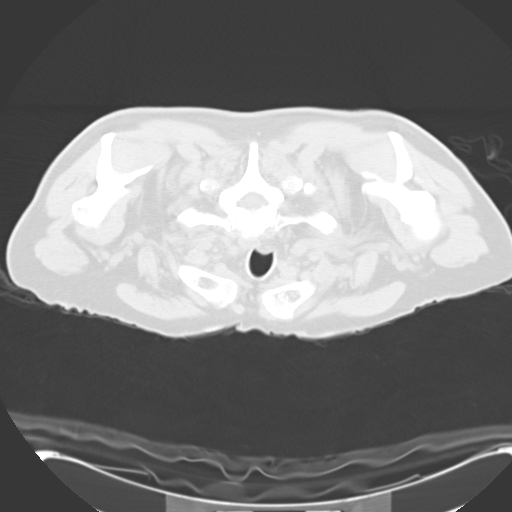

[Series 9: (hospital) 4.8 b30s · axial · 0.46mm/px · z∈[+678,+678]mm · 4 of 28 slices shown]
[im 6/28  soft-tissue]
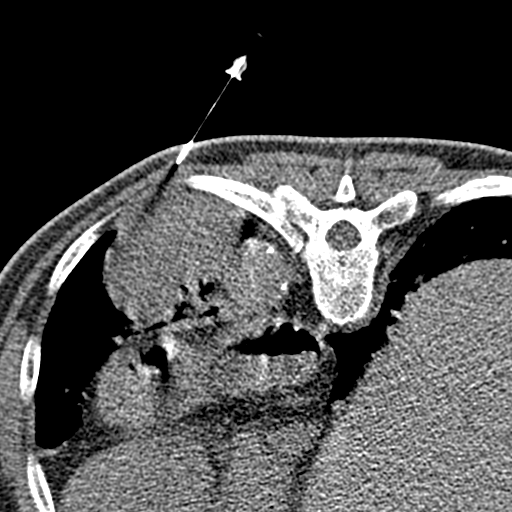
[im 11/28  soft-tissue]
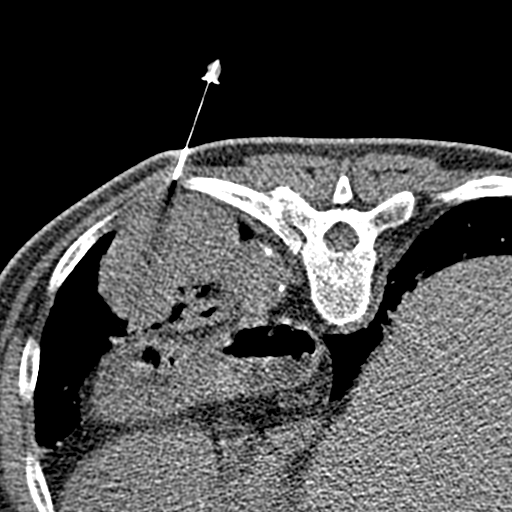
[im 17/28  soft-tissue]
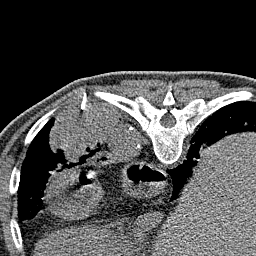
[im 22/28  soft-tissue]
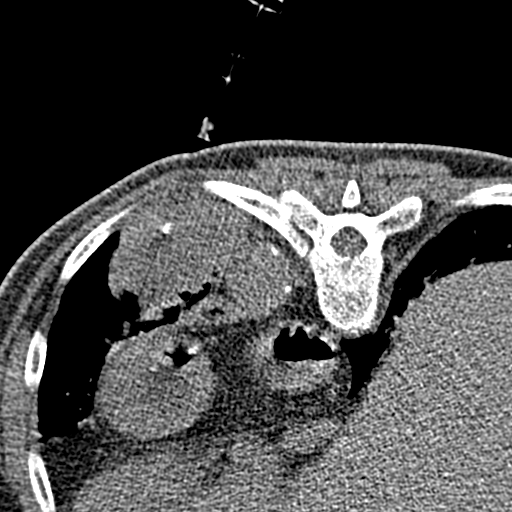

[14 of 32 positions shown; findings below may reference images not displayed]

PROCEDURE:     CT  - CT GUIDED BIOPSY or ASPIRATION  - August 30, 2011 [DATE]

RESULT:     Comparisons:  CT of the chest 08/17/2011

Procedure: Clinical assessment was performed and informed consent obtained.
The patient was brought to the CT suite and placed prone on the table. A
focused chest CT with localizing skin markers was performed demonstrating
the mass in the left lower lobe  The lesion was deemed amenable to biopsy.

The overlying skin was marked, prepped, and draped in the usual sterile
fashion. The skin and subcutaneous tissues were anesthetized with 1%
lidocaine. A 19 gauge coaxial needle was inserted through the chest wall and
lung with the tip abutting the lesion margin under CT guidance. A 20 gauge
core needle biopsy gun was inserted through the localizing sheath.  A total
of 4 core biopsy specimens were obtained and submitted to pathology.

The biopsy gun and outer needle were removed.  Immediate postprocedure
imaging demonstrated no pneumothorax or other complication.  The patient was
transferred to the recovery unit in stable condition.

Sedation: 0 mg of midazolam and 0 mcg of fentanyl.
IMPRESSION: Uncomplicated CT-guided biopsy of a left lower lobe mass.

## 2013-08-18 IMAGING — CR DG CHEST 1V PORT
1 series · 1 of 1 positions shown · non-contrast
Comparison: none

REASON FOR EXAM: vq scan
COMMENTS:

[portable]
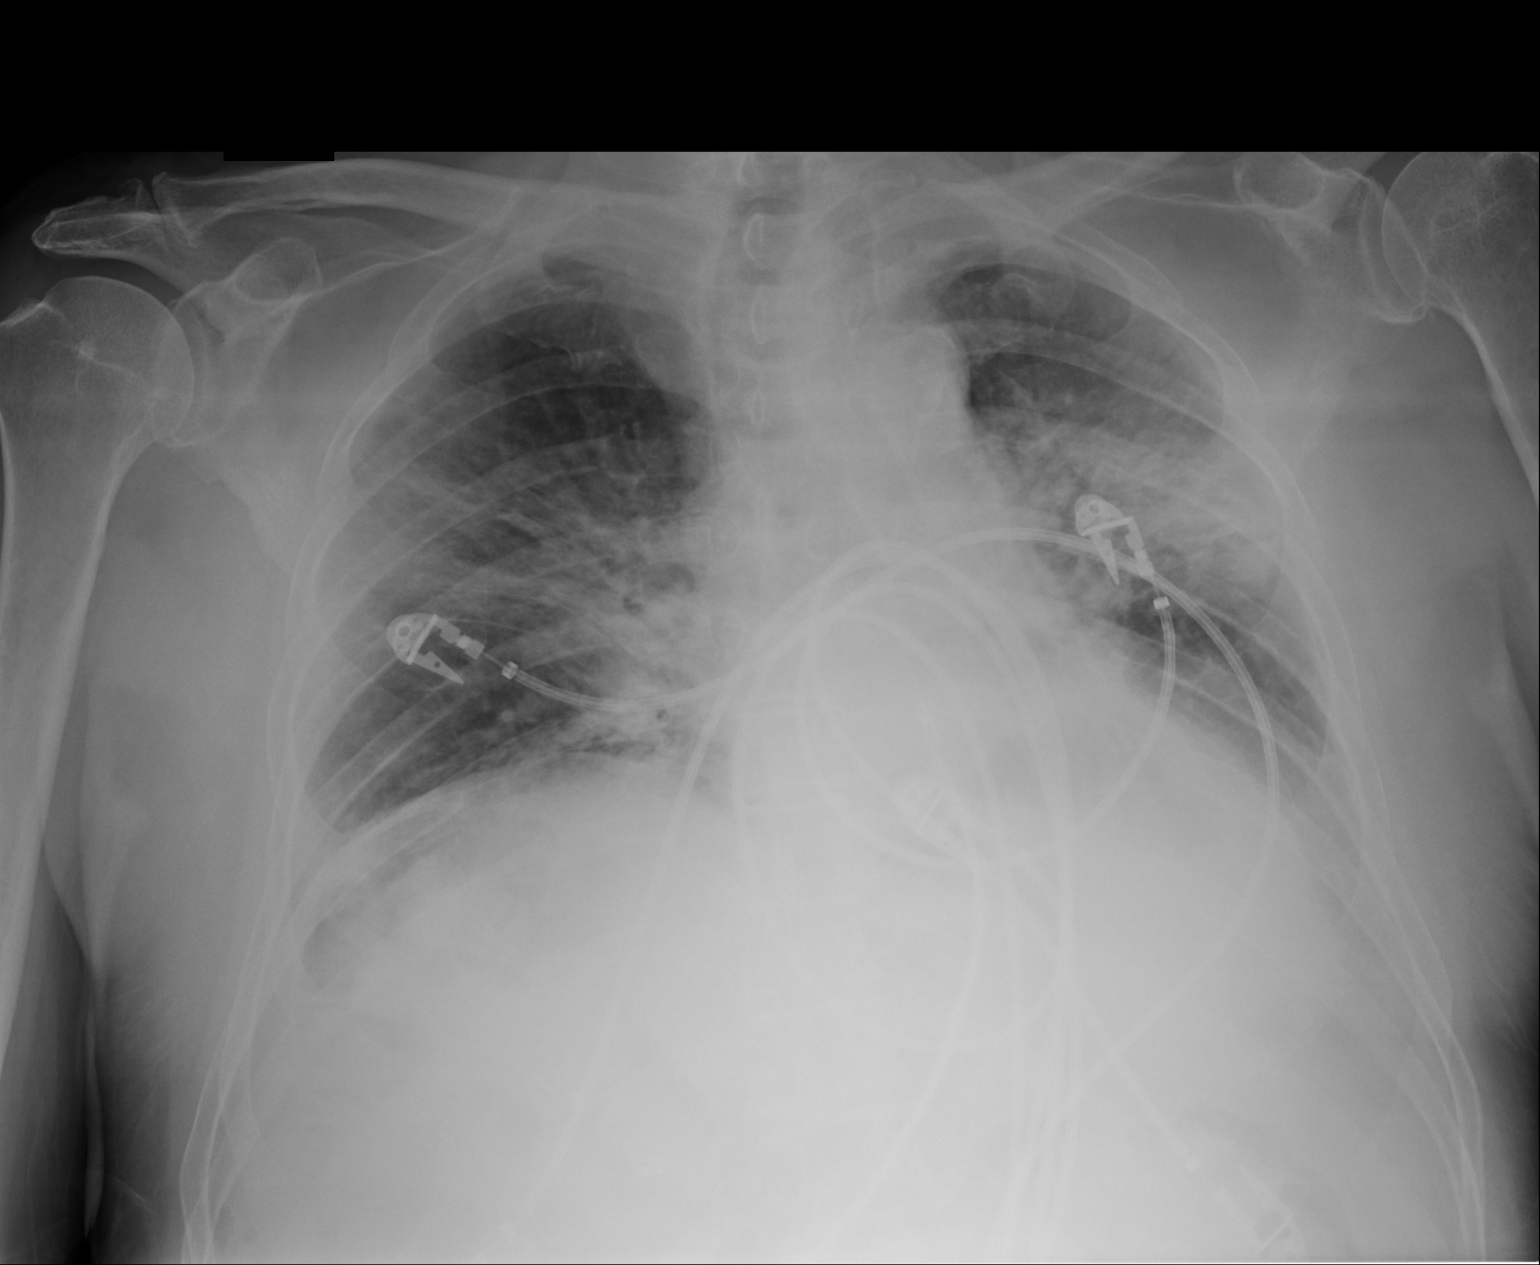

[1 of 1 positions shown; findings below may reference images not displayed]

PROCEDURE:     DXR - DXR PORTABLE CHEST SINGLE VIEW  - December 27, 2011 [DATE]

RESULT:     Comparison is made to the prior exam of 08/30/2011. The current
examination shows a patchy infiltrate in the left upper lobe. Also noted is
a diffuse infiltrate in the midportion of the right lung. The findings on
the right are compatible with pneumonia or atelectasis. No pleural effusion
is seen. No acute changes of the heart or pulmonary vasculature are
identified.
IMPRESSION: 1.     There are bilateral pulmonary infiltrates compatible with pneumonia
or atelectasis.

## 2014-12-14 NOTE — H&P (Signed)
PATIENT NAME:  Cody Marsh, Cody Marsh MR#:  [id number] DATE OF BIRTH:  10/13/1924  DATE OF ADMISSION:  07/17/2012  PRIMARY CARE PHYSICIAN: Dr. Bethann Punches.  CHIEF COMPLAINT: Increasing shortness of breath for 2 to 3 days.   HISTORY OF PRESENT ILLNESS: Cody Marsh is a pleasant 81 year old Caucasian gentleman with history of non-small cell lung cancer, status post radiation therapy, history of chronic pulmonary fibrosis, on home oxygen, chronic obstructive pulmonary disease with history of former smoker, comes to the Emergency Room accompanied by daughter and wife with the above-mentioned chief complaint. Per wife, who gives most of the history since Cody Marsh is currently short of breath and on BiPAP, she tells me the patient has been having increased episodes of dry cough without any recordable temperature/fever at home. He was found to have a more lethargy, less energy, and has been progressively staying on the couch and/or bed for the last couple of days. Hospice nurse went to check on him, found his saturations were down to the 70s. He received breathing treatments; however, given the patient's overall decline, the family decided to bring the patient to the Emergency Room. Next, in the ER the patient was kept on BiPAP. The patient reports feeling a little better. His saturations are hanging around 87 to 92% on BiPAP. Chest x-ray shows probable developing left lower lobe pneumonia. He has got a left lung mass. The patient is being admitted for further evaluation and management.   CODE STATUS: NO CODE, DO NOT RESUSCITATE. This was confirmed by the patient's wife, and the patient carries a portable DO NOT RESUSCITATE form.   PAST MEDICAL HISTORY:  1. Chronic pulmonary fibrosis.  2. Multiple skin cancers on the face, mainly basal cell carcinoma.  3. History of melanoma.  4. History of cholecystectomy.  5. Non-small cell lung cancer, status post radiation therapy left lower lobe. He used to follow  up with Dr. Sherrlyn Hock.  6. Degenerative joint disease.   ALLERGIES: No known drug allergies.   FAMILY HISTORY: Positive for stroke.   MEDICATIONS:  1. Tramadol 50 mg twice a day.  2. Torsemide 10 mg daily.  3. Prednisone 15 mg p.o. daily, chronic basis due to chronic obstructive pulmonary disease and pulmonary fibrosis.  4. Omeprazole 20 mg daily.  5. Multivitamin p.o. daily.  6. DuoNebs 4 times a day.  7. Alprazolam 0.25, 1/2 tablet three times a day.  8. Aggrenox 25/200, 1 tablet daily.   REVIEW OF SYSTEMS: Review of systems is not much obtainable since the patient is on BiPAP and much sleepy and lethargic.   PHYSICAL EXAMINATION:  GENERAL: The patient is lethargic, awakens up on verbal commands, however, he is currently on BiPAP and most of the time drifting off to sleep.   VITAL SIGNS: Temperature is 97.6, pulse is 113, tachycardic, respirations 22 per minute, blood pressure 100/58, sats are 87 to 92% on BiPAP.   HEENT: Atraumatic, normocephalic. Pupils are equal, round, and reactive to light and accommodation. Extraocular movements are intact. Oral mucosa is dry.   NECK: Supple. No JVD. No carotid bruit.   RESPIRATORY: There are decreased breath sounds in the bases bilaterally. Some coarse breath sounds heard bilaterally.   CARDIOVASCULAR: Tachycardia present. No murmur heard. PMI not lateralized.   EXTREMITIES: There is 2+ pitting edema on both lower extremities which appears chronic at present. I could not appreciate much pedal pulses. Femoral pulses palpable.   SKIN: Warm and dry.   ABDOMEN: Soft, benign, and nontender. No organomegaly.  NEUROLOGIC: The patient is somewhat lethargic from hypoxia and shortness of breath. He is able to move his extremities well. There is no focal weakness noted. Exam is limited.   PSYCH: The patient appears lethargic.   LABORATORY, DIAGNOSTIC AND RADIOLOGICAL DATA: White count is 12.9, hemoglobin and hematocrit 13.2 and 39.9, platelet  count is 140.0.  Glucose is 166, BUN is 32, bicarbonate is 40.0. Cardiac enzymes first set negative. B-type natriuretic peptide is 729.0. Arterial blood gas: pH is 7.28, pCO2 is 92, PaO2 of 100% and this is on nonrebreather oxygen. Urinalysis negative for urinary tract infection.   ASSESSMENT: Cody Marsh is an 81 year old with history of non-small cell lung cancer, chronic pulmonary fibrosis, chronic obstructive pulmonary disease on home oxygen, comes in with:   1. Altered mental status/acute encephalopathy: Appears secondary to hypoxemia and hypercarbia. His PaCO2 is 92.0. The patient currently is on BiPAP. He is waking up, although he drifts back off to sleep easily. We will continue him on BiPAP for now and continue monitor labs.  2. Acute on chronic hypoxic hypercarbic respiratory failure secondary to chronic obstructive pulmonary disease flare and suspected left lower lobe pneumonia in the setting of pulmonary fibrosis: I will admit the patient to telemetry floor. The patient's oxygen saturations were found to be in the 70s by Hospice RN on home oxygen. Chest x-ray shows some worsening infiltrate in the left lower lobe area of the lung mass. He has been having some dry cough for the last few days, more fatigued and work of breathing, per wife. We will admit the patient to telemetry floor, start the patient on IV Levaquin. I will start him on Solu-Medrol around-the-clock, continue Levaquin. Follow up blood cultures and sputum culture if the patient is able to produce sputum. Continue breathing treatments around-the-clock.  3. SIRS due to left lower lobe pneumonia: Continue IV Levaquin, continue to follow white cell count.   4. Lung cancer, non-small cell, mass in the left lower lobe status post radiation treatment: [facility]. No further treatments are indicated per wife.  5. Failure to thrive due to multiple comorbidities, more so due to chronic respiratory failure: The patient  is followed by Hospice at home. We will have Palliative Care see the patient while in-house.  6. Chronic pulmonary fibrosis.  7. Deep vein thrombosis prophylaxis with subcutaneous Lovenox.   Further work-up according to the patient's clinical course. The hospital admission plan was discussed with the patient's wife and daughter who were present in the Emergency Room.   CODE STATUS: The patient is a NO CODE, DO NOT RESUSCITATE.   CRITICAL TIME SPENT: 50 minutes.  ____________________________ Wylie HailSona A. Allena KatzPatel, MD sap:cbb D: 07/17/2012 19:11:27 ET T: 07/17/2012 19:46:43 ET JOB#: [id number]  cc: Gentri Guardado A. Allena KatzPatel, MD, <Dictator> Danella PentonMark F. Miller, MD Willow OraSONA A Zea Kostka MD ELECTRONICALLY SIGNED 07/20/2012 13:40

## 2014-12-14 NOTE — Discharge Summary (Signed)
PATIENT NAME:  Cody LimaDONALDSON, Bandy W MR#:  [id number] DATE OF BIRTH:  1924-11-14  DATE OF ADMISSION:  07/17/2012 DATE OF DISCHARGE:  07/22/2012  DISCHARGE DIAGNOSES:  1. Respiratory failure secondary to pulmonary fibrosis and left lower lobe pneumonia.  2. Non-small-cell lung cancer status post XRT, left lower lobe.  3. Cor pulmonale, acute on chronic.  4. Osteoarthritis.   DISCHARGE MEDICATIONS:  1. Tramadol 50 mg twice a day p.r.n. pain.  2. Torsemide 10 mg twice a day. 3. Tessalon 100 mg three times daily p.r.n. cough. 4. Midazolam 2.5 mg every other day.  5. Potassium chloride 10 mEq every other day.  6. Aggrenox 25/200 mg one 1 daily.  7. Xanax 0.25 mg 1/2 tablet three times daily p.r.n.  8. DuoNebs SVN four times daily. 9. Omeprazole 20 mg daily.  10. Prednisone 60 mg daily.   OXYGEN: 2 liters O2 nasal cannula.   REASON FOR ADMISSION: This is an 81 year old male who presents with profound hypoxia and respiratory failure. Please see History and Physical for history of present illness, past medical history, and physical exam.   HOSPITAL COURSE: The patient was admitted and found to have a worsened left lower lobe infiltrate thought due to pneumonia versus cor pulmonale versus exacerbation of his pulmonary fibrosis. He was given high-dose IV steroids, diuresed aggressively, antibiotics added. His oxygenation improved significantly over the course of the hospitalization and went from 100%      on nonrebreather down to 95% on 2 liters. He will be discharged on a higher dose of prednisone for slow weaning over time. His overall prognosis is poor with his multiple comorbidities. He is followed by hospice.  ____________________________ Danella PentonMark F. Myrtle Haller, MD mfm:slb D: 07/22/2012 07:22:22 ET T: 07/22/2012 13:29:27 ET JOB#: [id number]  cc: Danella PentonMark F. Katai Marsico, MD, <Dictator> Chantella Creech Sherlene ShamsF Keondre Markson MD ELECTRONICALLY SIGNED 07/23/2012 8:16

## 2014-12-19 NOTE — Consult Note (Signed)
PATIENT NAME:  Cody Marsh, Cody Marsh MR#:  [id number] DATE OF BIRTH:  [birth year]  DATE OF CONSULTATION:  01/04/2012  REFERRING PHYSICIAN:   Ned Clines, MD  CONSULTING PHYSICIAN:  Rosalyn Gess. Lateia Fraser, MD  REASON FOR CONSULTATION: Bilateral pulmonary infiltrates and hypoxia.   HISTORY OF PRESENT ILLNESS: The patient is an 81 year old white man with a past history significant for lung cancer status post XRT therapy who was admitted on 04/30 with progressive shortness of breath. The patient had been diagnosed with lung cancer fairly recently. He was not a surgical candidate due to problems with his residual lung function and the contralateral side having elevated hemidiaphragm. He decided not to pursue chemotherapy and received XRT only. He completed his XRT regimen approximately seven weeks prior to admission. He states that he did very well with this, however, he also states that he was given a steroid taper followed by 40 mg of prednisone with a very slow wean due to possible radiation pneumonitis. He reports that for a week or so prior to admission, his breathing got worse. It began mainly with exertional dyspnea. He has not had significant fevers, chills, or sweats. He has not had significant sputum production. He was given a course of antibiotics although it is unclear what he received prior to admission. He was found to be significantly hypoxic when he was seen by a Hospice nurse at home, who was visiting his wife. He was also tachycardic. He was brought to the Emergency Room and admitted to the hospital. He initially received vancomycin, Zosyn, fluconazole, and azithromycin. He had been on that regimen for approximately four days. The fluconazole was stopped for several days and then recently restarted. His vancomycin has been discontinued and linezolid has been started. No sputum cultures have been obtained. He has had no blood cultures obtained during his hospitalization and he has not been producing  any sputum. Despite the broad antibiotics he has had no significant improvement although today his FiO2 was decreased slightly. He has not been febrile during this hospitalization. His white count has remained normal with the exception of one reading of 11.5 on 12/29/2011. His ANC, however, has been elevated throughout his hospital stay. The patient states that he still feels about the same in terms of his breathing. He is neither significantly better or worse.   ALLERGIES: None.   PAST MEDICAL HISTORY:  1. Lung cancer status post XRT.  2. Hypertension.  3. Osteoarthritis.  4. Hypercholesterolemia.  5. Elevated right hemidiaphragm.  6. Transient ischemic attacks.  7. Status post cholecystectomy.   SOCIAL HISTORY: He is married. He is a prior smoker having quit several decades ago. He does not drink. He has no pets at home. He is very active in his church.   FAMILY HISTORY: Positive for cerebrovascular accidents.   REVIEW OF SYSTEMS: GENERAL: No fevers, chills, or sweats. No significant malaise. HEENT: No headaches. No sinus congestion. No sore throat.  NECK: No stiffness or swollen glands. RESPIRATORY: Positive shortness of breath. He has had some cough that has been nonproductive. CARDIAC: No chest pains or palpitations. He does have dyspnea on exertion.  GI: No nausea, no vomiting, no abdominal pain, no change in his bowels. GU: No change in his urine. MUSCULOSKELETAL: He has had no joint or muscle complaints. SKIN: No rashes.  NEUROLOGIC: No focal weakness. He has had some generalized fatigue with shortness of breath. PSYCHIATRIC: No complaints. All other systems are negative.   PHYSICAL EXAMINATION:  VITAL SIGNS: T-max 99, T-current  97.8, pulse 103, blood pressure 156/87, 96% on 56% high- flow nasal cannula. He has been as low as 41% during his hospitalization and as high as 60%.   GENERAL: 81 year old white man in no acute distress.   HEENT: Normocephalic, atraumatic, although there is  on the right temple area an area that appears to be a skin graft from a prior skin cancer. Pupils equal, reactive to light. Extraocular motion intact. Sclerae, conjunctivae, and lids are without evidence for emboli or petechiae. Oropharynx shows no erythema or exudate.  Gums are in fair condition. He does have dentures in place.   NECK: Midline trachea. No lymphadenopathy. No thyromegaly.   CHEST: Occasional crackles in the left base. There was decreased air movement on the right. He had no focal consolidation. He was able to speak in full sentences but was mildly tachypneic at times.   CARDIAC: Regular rate and rhythm without murmur, rub, or gallop.   ABDOMEN: Soft, nontender, and nondistended. No hepatosplenomegaly. No hernias noted.   EXTREMITIES: No evidence for tenosynovitis.   SKIN: No rashes. No stigmata of endocarditis, specifically no Janeway lesions or Osler nodes.   NEUROLOGIC: The patient is awake and interactive, moving all four extremities.   PSYCHIATRIC: Mood and affect appeared normal.   LABORATORY, DIAGNOSTIC, AND RADIOLOGICAL DATA: BUN 35, creatinine 1.03, bicarbonate 39, anion gap 7, AST 43, ALT 176, alkaline phosphatase 84, total bilirubin of 0.6. White count 10, hemoglobin of 13.2, platelet count 160. ANC 9.6, ALC 0. His white count on admission was 9.5 and peaked at 11.5. No blood cultures have been obtained. A Legionella urinary antigen was negative. Mycoplasma IgM was negative. Antibodies to aspergillus fumigatus and several other pneumonitis hypersensitivity antibodies were all negative. An echo was unremarkable. A CT scan from admission showed progression of an interstitial infiltrate in both lungs and underlying changes of emphysema. There is a mass in the left lower lobe. There are a few mildly enlarged mediastinal and hilar lymph nodes but no significant pleural effusion. Ultrasound of the lower extremity showed no evidence for deep vein thrombosis. Chest x-ray from  admission showed bilateral pulmonary infiltrates compatible with pneumonia versus atelectasis. A V/Q scan was low probability. A chest x-ray from today showed bilateral heterogeneous opacities that were similar to prior x-rays. Abdominal ultrasound showed a few tiny echogenic foci in the right hepatic lobe of uncertain etiology. There was a cystic structure in the left kidney   IMPRESSION:  81 year old white man with a history of lung cancer status post XRT admitted with hypoxia and bilateral interstitial infiltrates.   RECOMMENDATIONS:  1. He had progressive shortness of breath since his XRT. He had been treated with steroids prior to admission for possible radiation pneumonitis. He denies any fever, chills, or sweats. He has not had a fever in house and his white counts have been normal except for one measured at 11.5, but his ANC has been mildly elevated throughout his hospitalization. He is not producing any sputum.  2. At this point he is on extremely broad spectrum antibiotics including coverage for resistant  gram-positive cocci, gram-negative rods including pseudomonas, atypical organisms, and has had fluconazole added presumably for yeast coverage. Testing for Legionella and Mycoplasma have been negative. I would have expected that were he to have a gram-negative rod or a gram-positive cocci infection he would have a more productive cough. Typically solid tumor cancers would not necessarily disrupt his immune system if he had not received chemotherapy. If there is infection present, then a  more atypical organism is more likely. He has received adequate therapy for atypical organisms, however. (Legionella may require a longer therapy but his antigen was negative.)  3. Currently he is not being treated for three different groups of organisms: AFB, fungal, (fluconazole covers some yeast but not all yeast and no moles or dimorphic fungi), and PCP. Given his prolonged steroids I would have expected them  to worsen dramatically without coverage for these.  4. I would lean towards this not being infectious. We will plan on repeating a CT scan today. If it shows distinct improvement then we may need to continue some antibiotic coverage. If it is unchanged or worse, then I would stop the antibiotic therapy that he is currently on.  5. Regardless, the fluconazole can be stopped.  6. We will send chlamydia and fungal serologies and we will send a urine histo antigen.  7. We discussed that PCP is difficult to identify without a bronchoscopy. He is adamant about not having a bronchoscopy. Typically, PCP pneumonia treated with steroids alone, after a few days of improvement the patients become dramatically worse. If his CT is unchanged or worse, then would change all his antibiotics to trimethoprim sulfamethoxazole at PCP doses.  8. We also discussed that without a bronchoscopy we may not be able to figure out what is going on, and if he progresses and does not want intubation death could result. He is comfortable with this possibility.   This is a highly complex infectious disease case. Thank you very much for involving me in Mr. Chyrl CivatteDonaldson's care.   ____________________________ Rosalyn GessMichael E. Laveah Gloster, MD meb:bjt D: 01/04/2012 16:15:11 ET T: 01/05/2012 12:34:39 ET JOB#: [id number]  cc: Rosalyn GessMichael E. Virdell Hoiland, MD, <Dictator> Marielle Mantione E Dashanna Kinnamon MD ELECTRONICALLY SIGNED 01/07/2012 15:38

## 2014-12-19 NOTE — Consult Note (Signed)
Chief Complaint:   Subjective/Chief Complaint Overall better. LFT's remain the same. Bilirubin remains normal.  Recommendations: Patient to follow up with me in a week to 10 days (order written) for follow up and further evaluation of abnormal LFT's if needed.   Electronic Signatures: Lurline DelIftikhar, Emile Kyllo (MD)  (Signed 13-May-13 14:53)  Authored: Chief Complaint   Last Updated: 13-May-13 14:53 by Lurline DelIftikhar, Camora Tremain (MD)

## 2014-12-19 NOTE — Consult Note (Signed)
Brief Consult Note: Comments: Minimal elevation of AST and ALT most likely drug induced or secondary to pulmonary process. Transaminases are only minimally elevated and synthetic functions are normal. LFT's were normal few days ago.   Will obtain US liver and follow. I believe antibiotics and other medicines can be used without concern as long as we follow the LFT's. Thanks.  Electronic Signatures: Lurline DelIftikhar, Aviana Shevlin (MD)  (Signed 09-May-13 19:12)  Authored: Brief Consult Note   Last Updated: 09-May-13 19:12 by Lurline DelIftikhar, Kayan Blissett (MD)

## 2014-12-19 NOTE — Discharge Summary (Signed)
PATIENT NAME:  [name], Kacper W MR#:  [id number] DATE OF BIRTH:  March 26, 1925  DATE OF ADMISSION:  12/25/2011 DATE OF DISCHARGE:  01/08/2012  ADDENDUM:  TIME SPENT: Over 45 minutes were spent in discharging this patient.  ____________________________ Danella PentonMark F. Swayzee Wadley, MD mfm:drc D: 01/08/2012 08:03:51 ET T: 01/08/2012 15:28:02 ET JOB#: [id number]  cc: Danella PentonMark F. Hades Mathew, MD, <Dictator> Ireta Pullman Sherlene ShamsF Amulya Quintin MD ELECTRONICALLY SIGNED 01/10/2012 7:51

## 2014-12-19 NOTE — Discharge Summary (Signed)
PATIENT NAME:  Cody LimaDONALDSON, Bryne W MR#:  [id number] DATE OF BIRTH:  06/02/1925  DATE OF ADMISSION:  12/25/2011 DATE OF DISCHARGE:  01/08/2012  DISCHARGE DIAGNOSES:  1. Respiratory failure due to bilateral pneumonia.  2. Abnormal liver function tests.  3. Thrombocytopenia secondary to Lovenox.  4. History of transient ischemic attacks.  5. Osteoarthritis.  6. Left-sided lung cancer.  7. Hypertension.  8. Diabetes mellitus, steroid-induced.   DISCHARGE MEDICATIONS:  1. Multivitamin daily. 2. Omeprazole 20 mg daily.  3. Aggrenox tab 1 daily.  4. Magnesium oxide 500 mg daily.  5. B complex daily.  6. Lantus insulin 25 units at bedtime.  7. Glimepiride 4 mg daily.  8. Diltiazem ER 180 mg daily.  9. Hydralazine 50 mg three times daily. 10. Prednisone 60 mg daily. 11. 5 liters O2 nasal cannula.  12. DuoNeb nebulizer four times daily.   REASON FOR ADMISSION: This is an 81 year old male who presents with respiratory failure. Please see the history and physical for history of present illness, past medical history, and physical exam.   HOSPITAL COURSE: The patient was admitted to the Intensive Care Unit and placed on Zosyn, high-dose steroids, and IV azithromycin and then ultimately fluconazole with azithromycin. He really continued to not improve with bilateral patchy infiltrates. CT scan did not show PE. V/Q was low probably for pulmonary embolus. He remained on high flow oxygen, around 55%. He did not want to be intubated and thus never went to bronchoscopy. Over the hospital course, he slowly improved over almost a two week period. Several attempts were made to transfer him to medical centers but these were denied by the medical centers. His ALT began rising. His antibiotics after 9 to 10 days were discontinued, steroids were continued and his oxygenation started to improve. Dr. Leavy CellaBlocker initially thought PCP treatment might be worthwhile, but then as he was able to improve decided against  that. His sugars were controlled with insulin, thought related to the diabetes. His Lovenox was discontinued as his platelets began to drop at 91,000 on discharge. He is down to 5 liters. Chest x-ray showed really minimal improvement at most. Gastroenterology saw him in regard to his LFTs, which continued to slowly rise despite discontinuing the antibiotics. He had no abdominal pain. He is ready to go home. His overall prognosis is very guarded. His tumor postradiation on the left side did show some improvement post radiation per CT. He will go home with home health, PT, and nursing.  ____________________________ Danella PentonMark F. Bracha Frankowski, MD mfm:slb D: 01/08/2012 08:03:19 ET T: 01/08/2012 15:35:17 ET JOB#: [id number]  cc: Danella PentonMark F. Aleeta Schmaltz, MD, <Dictator> Sherrell Weir Sherlene ShamsF Anihya Tuma MD ELECTRONICALLY SIGNED 01/10/2012 7:51

## 2014-12-19 NOTE — Consult Note (Signed)
Reason for Visit: This 81 year old Male patient presents to the clinic for initial evaluation of  Lung cancer .   Referred by Dr. Sherrlyn Hock.  Diagnosis:   Chief Complaint/Diagnosis 81 year old male with clinical stage IIa (T3, N0, M0) poorly differentiated left lower lobe lung cancer favoring squamous cell carcinoma    Imaging Report PET/CT and CT scans reviewed    Referral Report Clinical notes reviewed    Planned Treatment Regimen IMRT radiation therapy versus surgery    HPI Patient is an 82 year old male well known to me as his wife is a former patient. Saw him about a month ago when he had an abnormal chest x-ray after a several month history of increasing cough. Chest x-ray revealed a large left lower lobe mass and CT scan showed Gleason 6 cm mass with associated incidental large hiatal hernia. 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 He had screening 100 function tests back in October which showed an FEV1 of 43% of predicted. CT guided biopsy revealed poorly differentiated carcinoma consistent with squamous origin. PET scan demonstrated left lower lobe mass as being avid no evidence of avid mediastinal adenopathy. He has lost about 6 pounds over the past year he does have moderate shortness of breath and dyspnea on exertion is able to play golf and plays golf about once a week. He is seen today for consideration of radiation therapy is also been seen by Dr. Inez Catalina for opinion regarding thoracic surgery.   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    Gastrointestinal GERD    Neurological/Psychiatric TIA    Past  Surgical History cholecystectomy; Right temple melanoma with skin graft    Past Medical History Comments Arthritis    Family History noncontributory    Social History noncontributory    Additional Past Medical and Surgical History Accompanied by wife and daughter today   Allergies:   NKDA: None  Home Meds:  Home Medications:  Multiple Vitamins oral capsule: 1 tab(s) po once a day , Active  omeprazole 20 mg oral delayed release capsule: 1 tab(s) po once a day , Active  verapamil 120 mg oral tablet: 1 tab(s) po once a day , Active  lisinopril 20 mg oral tablet: 0.5 tab(s) po once a day , Active  Aggrenox oral capsule, extended release: 1 tab(s)  once a day, Active  Mag-Ox: 500 milligram(s) orally once a day, Active  imiquimod 5% topical cream: 1 application topically once a day (at bedtime), Active  B-Complex with B-12 oral tablet: 1 tab(s) orally once a day, Active  Review of Systems:   General negative    Performance Status (ECOG) 0    Skin see HPI    Breast negative    Ophthalmologic negative    ENMT negative    Respiratory and Thorax see HPI    Cardiovascular see HPI    Gastrointestinal see HPI    Genitourinary negative    Musculoskeletal negative    Neurological negative    Psychiatric negative    Hematology/Lymphatics negative    Endocrine negative    Allergic/Immunologic  negative   Nursing Notes:  Nursing Vital Signs and Chemo Nursing Nursing Notes: *CC Vital Signs Flowsheet:   10-Jan-13 14:33   Temp Temperature 97   Pulse Pulse 116   Respirations Respirations 18   SBP SBP 137   DBP DBP 76   Pulse Oxi  97   Current Weight (kg) (kg) 77   Physical Exam:  General/Skin/HEENT:   General normal    Skin normal    Eyes normal    ENMT normal    Head and Neck normal    Additional PE Well-developed well-nourished male in NAD. Has old well-healed skin graft on his right temple. Lungs are clear to A&P he does have some decreased breath sounds  in his left lower lobe. Cardiac examination shows regular rate and rhythm. No supraclavicular adenopathy is appreciated.   Breasts/Resp/CV/GI/GU:   Respiratory and Thorax normal    Cardiovascular normal    Gastrointestinal normal    Genitourinary normal   MS/Neuro/Psych/Lymph:   Musculoskeletal normal    Neurological normal    Lymphatics normal   Other Results:  Radiology Results: CT:    22-Oct-12 13:09, CT Chest With Contrast   CT Chest With Contrast    REASON FOR EXAM:    Abn Chest Xray  COMMENTS:       PROCEDURE: CT  - CT CHEST WITH CONTRAST  - Jun 18 2011  1:09PM     RESULT:     History: Abnormal chest x-ray.    Findings: Standard IV contrast enhanced chest CT obtained with 70 mL of   Isovue-300.There is a 5.7 cm left lower lobe mass suspicious for   malignancy. This lesion is amenable to biopsy. Small hilar and   mediastinal lymph nodes are noted. These are nonspecific but   malignancy/metastatic disease cannot be excluded. Adrenals are normal.   Basilar atelectasis is present. No free air noted. PET/CT may prove     useful for evaluation.     IMPRESSION:     1. Large solid mass lesion left lower lobe. This could represent a   malignancy. PET CT may prove useful for further evaluation. There are   small mediastinal and hilar lymph nodes. These are nonspecific.  2. Large sliding hiatal hernia is incidentally noted.    Thank you for the opportunity to contribute to the care of your patient.           Verified By: Gwynn BurlyHOMAS E. REGISTER, M.D., MD  Nuclear Med:    26-Oct-12 10:45, PET/CT Scan Lung Cancer Diagnosis   PET/CT Scan Lung Cancer Diagnosis    REASON FOR EXAM:    left lung mass  COMMENTS:       PROCEDURE: PET - PET/CT DX LUNG CA  - Jun 22 2011 10:45AM     RESULT:     History: Left lung mass.    Comparison Study: Chest CT of 06/18/2011.     FINDINGS:   Following determination of a fasting blood sugar of 109   mg/dL, 19.112.8 mCi [id number] FDG was  administered to the patient and PET CT   obtained. Again noted is a large left lower lobe mass that is intensely   PET positive with mean SUV values of 12.9 and maximum SUV values of 16.4.     This is most consistent with malignancy, however, round pneumonia could   also present in this fashion.    IMPRESSION:      Large intensely PET positive left lower lobe rounded mass lesion.  Differential diagnosis includes bronchogenic carcinoma versus round   pneumonia.    Thank you for the opportunity to contribute to the care of your patient.           Verified By: Gwynn Burly, M.D., MD   Assessment and Plan:   Impression At least stage IIa clinical stage squamous cell carcinoma the left lower lobe in 81 year old male    Plan The stomach ahead discussion with the patient and his family regarding risks and benefits of radiation therapy. He is a borderline surgical candidate based on his primary function tests as well as history of right diaphragm paralysis age and performance status. Dr. Inez Catalina is repeating his pulmonary function tests at this time to see if there is a possibility of improvement. Patient is strongly leaning towards nonsurgical treatment and I have offered IMRT radiation therapy to 7000 cGy. We will follow NCC N. guidelines as far as dose constraints or concerns. I will discuss the case personally with Dr. Sherrlyn Hock should he decide for radiation may want to add weekly chemotherapy to boost R. response based on the large size of his tumor. Again risks and that benefits of radiation therapy were discussed with the patient and his family and all seem to comprehend my treatment plan well. I have tentatively given him an appointment for treatment planning at the end of next week.  I would like to take this opportunity to thank you for allowing me to continue to participate in this patient's care.   CC Referral:   cc: Dr. Bethann Punches, Dr. Meredeth Ide   Electronic  Signatures: Rushie Chestnut, Gordy Councilman (MD)  (Signed 10-Jan-13 16:05)  Authored: HPI, Diagnosis, Past Hx, PFSH, Allergies, Home Meds, ROS, Nursing Notes, Physical Exam, Other Results, Encounter Assessment and Plan, CC Referring Physician   Last Updated: 10-Jan-13 16:05 by Rebeca Alert (MD)

## 2014-12-19 NOTE — Consult Note (Signed)
Chief Complaint:   Subjective/Chief Complaint Overall feels somewhat better. LFT's overall same. No GI complaints.   VITAL SIGNS/ANCILLARY NOTES: **Vital Signs.:   10-May-13 16:09   Vital Signs Type Routine   Temperature Temperature (F) 98.3   Celsius 36.8   Temperature Source oral   Pulse Pulse 111   Pulse source per Dinamap   Respirations Respirations 20   Systolic BP Systolic BP 197   Diastolic BP (mmHg) Diastolic BP (mmHg) 90   Mean BP 113   BP Source Dinamap   Pulse Ox % Pulse Ox % 94   Pulse Ox Activity Level  At rest   Oxygen Delivery HFNC50%   Routine Hem:  10-May-13 04:24    WBC (CBC) 10.0   RBC (CBC) 4.64   Hemoglobin (CBC) 13.2   Hematocrit (CBC) 40.5   Platelet Count (CBC) 160   MCV 88   MCH 28.4   MCHC 32.5   RDW 16.6   Neutrophil % 96.0   Lymphocyte % 0.3   Monocyte % 3.5   Eosinophil % 0.0   Basophil % 0.2   Neutrophil # 9.6   Lymphocyte # 0.0   Monocyte # 0.4   Eosinophil # 0.0   Basophil # 0.0  Routine Chem:  10-May-13 04:24    Glucose, Serum 394   BUN 35   Creatinine (comp) 1.03   Sodium, Serum 141   Potassium, Serum 3.9   Chloride, Serum 95   CO2, Serum 39   Calcium (Total), Serum 8.4  Hepatic:  10-May-13 04:24    Bilirubin, Total 0.6   Alkaline Phosphatase 84   SGPT (ALT) 176   SGOT (AST) 43   Total Protein, Serum 5.9   Albumin, Serum 2.8  Routine Chem:  10-May-13 04:24    Osmolality (calc) 306   eGFR (African American) >60   eGFR (Non-African American) >60   Anion Gap 7   LDH, Serum 484   Radiology Results: [place]:    10-May-13 09:24, US Abdomen General Survey   US Abdomen General Survey    REASON FOR EXAM:    hepatitis  COMMENTS:       PROCEDURE: [place]  - US ABDOMEN GENERAL SURVEY  - Jan 04 2012  9:24AM     RESULT: Comparison: None.    Technique: Multiple grayscale and color Doppler images were obtained of   the abdomen.    Findings:  Evaluation is limited secondary to technical factors. The pancreas is   obscured  by overlying bowel gas. There are a few tiny echogenic foci in   the right hepatic lobe which are of uncertain etiology and may be   artifactual. No calcifications were seenwithin the liver on the prior     PET CT. The patient is status post cholecystectomy. The common bile duct   measures 3 mm. The visualized spleen is unremarkable. The portal vein is   patent.  There is an anechoic structure in the region of the pelvis of   the inferior pole of the left kidney. This was seen on the prior PET CT.   This may represent a large parapelvic cyst versus dilatation of the   inferior left renal pelvis. There is a 2.4 cm cyst in the right kidney.    IMPRESSION:   1. There are a few tiny echogenic foci in the right hepatic lobe which   are of uncertain etiology and may be artifactual. While these have the   appearance of tiny calcifications, no calcifications were  seen on the   prior PET CT. While felt unlikely, if thereis clinical concern for   portal venous gas, further evaluation with CT would be recommended.  2. Cystic structure in the inferior pole of the left kidney is similar to   prior PET CT. This may represent a large parapelvic cyst versus     dilatation of the inferior left renal pelvis.          Verified By: Gregor Hams, M.D., MD   Assessment/Plan:  Assessment/Plan:   Assessment Patient with mild elevation of transaminases most likely drug induced. LFT's were unremarkable initially. [place] with tiny echogenic foci. Doubt portal venous gas as abdominal examination is fairly benign. Severe pneumonitis.    Plan Will continue to observe as LFT abnormality is expected to improve with clinical improvement and once he is off of some of the medicines. If not, further recommendations will be made. Dr. Vira Agar will follow over the weekend.  CT of abdomen is an option if there are any clinical concerns of portal venous gas which seems unlikely at this point.   Electronic  Signatures: Jill Side (MD)  (Signed [phone] 16:45)  Authored: Chief Complaint, VITAL SIGNS/ANCILLARY NOTES, Lab Results, Radiology Results, Assessment/Plan   Last Updated: 10-May-13 16:45 by Jill Side (MD)

## 2014-12-19 NOTE — Consult Note (Signed)
PATIENT NAME:  Cody Marsh, Cody Marsh MR#:  [id number] DATE OF BIRTH:  1924/12/27  DATE OF CONSULTATION:  01/04/2012  REFERRING PHYSICIAN:   CONSULTING PHYSICIAN:  Lurline Del, MD  REASON FOR CONSULTATION: Abnormal liver enzymes.   HISTORY OF PRESENT ILLNESS: 81 year old male who was admitted about 10 days ago. Patient has history of lung cancer and recent radiation therapy. Patient was admitted with cough, wheezing and shortness of breath. He has had a pretty rocky course since admission and he was found to have significant bilateral pneumonitis. It is not clear if it is radiation induced or infectious although no infectious organism has been isolated. Patient is on multiple antibiotics as well as steroids for suspected radiation-induced pneumonitis. Patient's liver enzymes were apparently normal on admission and they have become abnormal since then mainly with elevation of AST and ALT, bilirubin and alkaline phosphatase have been pretty unremarkable. GI was consulted. Patient was seen yesterday. He appears to be severely short of breath. Denies any abdominal pain. Denies any nausea or vomiting. Denies any fever or chills. Denies any history of prior liver disease.   PAST MEDICAL HISTORY:  1. Hypertension. 2. Lung cancer.  3. Osteoarthritis.  4. Right bundle branch block.  5. Hyperlipidemia.  6. History of transient ischemic attacks.   PAST SURGICAL HISTORY:  1. Cholecystectomy. 2. Basal cell carcinoma of the face.   HOME MEDICATIONS:  1. Aggrenox. 2. Omeprazole. 3. Flomax.   SOCIAL HISTORY: Does not smoke or drink.   FAMILY HISTORY: Unremarkable.   REVIEW OF SYSTEMS: Positive with significant shortness of breath but is otherwise unremarkable.   PHYSICAL EXAMINATION:  GENERAL: Ill-appearing male.   VITAL SIGNS: Does not appear to be in respiratory distress, breathing around 25 per minute. He has been afebrile. Heart rate is around 110, blood pressure is about 150/90.   NECK:  Neck veins are flat.   LUNGS: Examination showed scattered bilateral crackles and wheezes.   CARDIOVASCULAR: Regular rate and rhythm.   ABDOMEN: Slightly distended abdomen. No ascites were noted. No hepatosplenomegaly. Abdomen is nontender and benign.  NEUROLOGIC: Unremarkable.   LABORATORY, DIAGNOSTIC AND RADIOLOGICAL DATA: Most recent labs: White cell count is normal at 10, hemoglobin 13.2, hematocrit 40.5, platelet count 160. LDH 484. Serum albumin is low at 2.8, AST 43, ALT 176, alkaline phosphatase 84, bilirubin 0.6. Those numbers have slowly risen to this point and they were normal a few days ago on admission. D-dimer 0.9. Chest x-ray showed significant bilateral heterogeneous opacities. Ultrasound of the abdomen done today shows normal common bile duct, few tiny echogenic foci were noted in the right hepatic lobe which are of uncertain etiology and may be artifactual according to the radiologist. Biliary air was mentioned by the radiologist although unlikely according to him.   ASSESSMENT AND PLAN: Patient with slowly rising transaminases. He has no prior history of liver disease and his liver enzymes were normal on admission. This is probably drug-induced hepatitis caused by multiple antibiotics. Synthetic liver function such as bilirubin appears to be normal. Patient also has severe pneumonitis either infectious or radiation induced and is severely hypoxic at this point. Ultrasound of the liver shows some tiny echogenic fossae which were read as probably artifactual by the radiologist although presence of portal venous gas was mentioned as well. Patient has no leukocytosis, no fever and has a very benign abdomen and I doubt that we are dealing with portal vein gas which is usually due to some sort of catastrophic intra-abdominal process. I would recommend close  observation and follow-up of his liver enzymes. A CT scan of the abdomen can be done if the concern for portal venous abnormality  remains high, although again as mentioned above clinically this seems to be unlikely at this point. No further intervention for abnormal liver enzymes at this point, although the liver enzymes continue to be abnormal after clinical improvement and after him being on some of the medicines then further investigation can be done. Dr. Mechele CollinElliott will follow him over the weekend and I will resume care on Monday.   ____________________________ Lurline DelShaukat Beena Catano, MD si:cms D: 01/04/2012 16:53:44 ET T: 01/05/2012 13:19:30 ET JOB#: [id number]  cc: Lurline DelShaukat Analisa Sledd, MD, <Dictator> Lurline DelSHAUKAT Deette Revak MD ELECTRONICALLY SIGNED 01/07/2012 17:38

## 2014-12-19 NOTE — Consult Note (Signed)
Impression:  81yo WM w/ h/o lung CA, s/p XRT admitted with hypoxia and bilateral interstitial infiltrates.  He had progressive SOB since his XRT.  He had been treated with steroids prior to admission for possible radiation pneumonitis.  He denies any fever, chills or sweats.  He has not had any fever in house. His WBCs have been normal, except for one measurement of 11.5, but his ANC has been mildly elevated.  He is not producing any sputum. At this point, he is on extremely broad spectrum antibiotics, including coverage for resistant GPC, GNR including Pseudomonas, atypical organisms and has had fluconzole added, presumedly for yeast coverage.  Testing for Legionella and Mycoplasma are negative.  I would expect that were he to have a GNR or GPC infection, he would have had a more productive cough.  If there is infection present, then a more atypical organism is more likely.  He has received adequate therapy for atypical organisms (Legionella may require longer therapy, but his antigen was negative). Currently, he is not being treated for three different groups of organisms:  AFB, fungal (fluconazole covers some yeast, but not all yeast and no molds or dimophic fungi) and PCP.  Given his prolonged steroids, I would have expected him to worsen dramatically without coverage for these. I would lean towards this not being infectious.  Will repeat his CT scan today.  If it shows distinct improvement, then we may need to continue some of the antibiotic coverage.  If it is unchanged or worse, then I would stop the antibiotic therapy.  Regardless, fluconazole can be stopped.   Will send Chlamydia and fungal serologies.  Will send histo urine Ag. We discussed that PCP is difficult to identify without a bronchoscopy.  He is adament about not having a bronchoscopy.  Typically with PCP pneumonia treated with steroids, after a few days of improvement, pts become dramatically worse.  If his CT is unchanged or worse, would change  all his antibiotics to TMP/SMX. We also discussed that without a bronchoscopy we may not be able to figure out what is going on and if he progresses and does not want intubation, that death could result.  He is comfortable with this possibility.   Electronic Signatures: Deyanira Fesler, Rosalyn GessMichael E (MD) (Signed on 10-May-13 16:02)  Authored   Last Updated: 10-May-13 16:15 by Damoney Julia, Rosalyn GessMichael E (MD)

## 2014-12-19 NOTE — Consult Note (Signed)
Chief Complaint:   Subjective/Chief Complaint Admitted for shortness of breath.  History of lung cancer and currently undergoing radiation therapy.  Diagnosed with pneumonitis.  GI consultation was requested due to elevation of LFTs.  He has been under the care of Dr. Dionne Marsh.  Seen today in rounding with Dr. Gaylyn Marsh.  Patient states he is feeling better.  Respiratory status has continued to improve.  Denies any abdominal pain, nausea or vomiting.  Appetite is good.  Wishes to go home.  In review of LFT trending downward is noted.   VITAL SIGNS/ANCILLARY NOTES: **Vital Signs.:   11-May-13 05:31   Vital Signs Type Routine   Temperature Temperature (F) 98   Celsius 36.6   Temperature Source oral   Pulse Pulse 82   Pulse source per Dinamap   Respirations Respirations 20   Systolic BP Systolic BP 761   Diastolic BP (mmHg) Diastolic BP (mmHg) 91   Mean BP 120   BP Source Dinamap   Pulse Ox % Pulse Ox % 94   Pulse Ox Activity Level  At rest   Oxygen Delivery HFNC50%   Pulse Ox Heart Rate 81    08:35   Vital Signs Type Routine   Temperature Temperature (F) 98.1   Celsius 36.7   Temperature Source oral   Pulse Pulse 91   Pulse source per Dinamap   Respirations Respirations 18   Systolic BP Systolic BP 607   Diastolic BP (mmHg) Diastolic BP (mmHg) 94   Mean BP 116   BP Source Dinamap   Pulse Ox % Pulse Ox % 95   Oxygen Delivery HFNC 50%   Brief Assessment:   Cardiac Irregular  no murmur  -- LE edema    Respiratory normal resp effort  clear BS  no use of accessory muscles    Gastrointestinal Normal    Additional Physical Exam Alert and oriented x 4.  Wife sitting on bed.  Patient appears to be resting well.  NAD noted.     Routine Chem:  11-May-13 06:04    Glucose, Serum 355   BUN 30   Creatinine (comp) 1.06   Sodium, Serum 144   Potassium, Serum 3.9   Chloride, Serum 100   CO2, Serum 37   Calcium (Total), Serum 8.1  Hepatic:  11-May-13 06:04    Bilirubin,  Total 0.5   Alkaline Phosphatase 81   SGPT (ALT) 146   SGOT (AST) 34   Total Protein, Serum 5.5   Albumin, Serum 2.6  Routine Chem:  11-May-13 06:04    Osmolality (calc) 307   eGFR (African American) >60   eGFR (Non-African American) >60   Anion Gap 7  Routine Hem:  11-May-13 06:04    WBC (CBC) 9.2   RBC (CBC) 4.25   Hemoglobin (CBC) 12.1   Hematocrit (CBC) 37.1   Platelet Count (CBC) 129   MCV 87   MCH 28.4   MCHC 32.5   RDW 16.5   Neutrophil % 95.2   Lymphocyte % 0.3   Monocyte % 4.4   Eosinophil % 0.0   Basophil % 0.1   Neutrophil # 8.7   Lymphocyte # 0.0   Monocyte # 0.4   Eosinophil # 0.0   Basophil # 0.0   Assessment/Plan:  Assessment/Plan:   Assessment 81 yo Caucasian gentleman who is currently being treated for lung cancer receiving radiation therapy.  Admitted for shortness of breath.  Diagnosed with pneumotitis. Pneumonia. GI consultation requested for elevation of liver enzymes  felt to be in correlation with current pulmonary status.  With improvement of pulmonary status liver enzymes are trending downward.    Plan 1.  Patient's presentation was discussed with Dr. Gaylyn Marsh.  Recommendation is for continued monitoring and trending of liver enzymes.   2.  No further GI evaluation is recommended at this time.  GI will sign-off at this point.  Reconsult if needed.  Seen in collaboration with Dr. Gaylyn Marsh.   Electronic Signatures: Cody Marsh (NP)  (Signed 11-May-13 14:05)  Authored: Chief Complaint, VITAL SIGNS/ANCILLARY NOTES, Brief Assessment, Lab Results, Assessment/Plan   Last Updated: 11-May-13 14:05 by Cody Marsh (NP)

## 2014-12-19 NOTE — Consult Note (Signed)
No new complaints, breathing better, still on oxygen which he also has at home.  ALT coming down slowly, AST nl.  No new suggestions.  Electronic Signatures: Scot JunElliott, Jamelyn Bovard T (MD)  (Signed on 12-May-13 10:58)  Authored  Last Updated: 12-May-13 10:58 by Scot JunElliott, Stephenie Navejas T (MD)

## 2014-12-19 NOTE — H&P (Signed)
PATIENT NAME:  Cody Marsh, Cody Marsh MR#:  [id number] DATE OF BIRTH:  04/26/1925  DATE OF ADMISSION:  12/25/2011  CHIEF COMPLAINT: Shortness of breath, tachycardia.   HISTORY OF PRESENT ILLNESS: Mr. Cody Marsh is a pleasant 81 year old man recently undergoing treatment for lung cancer with radiation. He is Dr. Rondel BatonMiller's normal patient. He has had some congestion, coughing, and wheezing and lately has been treated with antibiotics and prednisone. He has also been given a diuretic recently of unknown type by Dr. Hyacinth MeekerMiller two to three days ago. The dictation is not back yet and the patient is unsure. He is more and more short of breath, dizzy, not feeling well, tachycardic. The nurse was at his house to see his wife today and found his saturations to be in the 70s and pulse to be 130 to 150. They called here and he was asked to come in and see me. He denies pain or distress otherwise. Pulse oximetry was 81 when we checked it.   REVIEW OF SYSTEMS: Negative other than what is noted above.   PAST MEDICAL HISTORY:  1. Hypertension.  2. Lung cancer, as noted. 3. Osteoarthritis.  4. Right bundle branch block.  5. Hyperlipidemia.  6. Elevated right hemidiaphragm.  7. History of transient ischemic attacks.   PAST SURGICAL HISTORY:  1. Cholecystectomy.  2. Basal cell of face.   MEDICATIONS: 1. Aggrenox twice a day. 2. Omeprazole 20 mg daily.  3. Flomax 0.4 mg daily.   ALLERGIES: No known drug allergies.  SOCIAL HISTORY: He is married. His wife has endstage cancer. He stopped smoking in the 70s.   FAMILY HISTORY: Strokes.   PHYSICAL EXAMINATION:   GENERAL: Elderly, mildly ill appearing, no distress.   VITALS: Pulse oximetry 81 on room air, blood pressure 108/73, pulse 120s and regular, and respirations 25.   HEENT: TMs are clear. Oropharynx clear.   NECK: No bruits, thyromegaly, adenopathy, or JVD.    CHEST: Clear to auscultation and percussion.  HEART: Regular rate and rhythm. No murmurs,  rubs, or gallops.   ABDOMEN: Soft and nontender.   EXTREMITIES: Minimal edema, 1+ at most.   NEUROLOGICAL: Nonfocal.   LABS/STUDIES: All we have back at this point is a CBC which is normal.   CT of the chest to rule out PE has been ordered.   He had a CT with contrast, not to rule out PE, that showed potential pneumonitis with ground-glass appearance and airspace disease last week.   ASSESSMENT AND PLAN: Tachycardia and marked hypoxia in the setting of recent lung cancer, radiation therapy, etc.: He is at high risk for pulmonary emboli which we need to evaluate more fully and work-up is ongoing. Other lab values as well. He is now in the Intensive Care Unit on 6 liters nasal cannula with pulse oximetry around 90. This may need to be titrated further. He is quite ill. He IS still noted to be a FULL CODE. Further work-up and treatment pending his response to the above. ____________________________ Marya AmslerMarshall Marsh. Dareen PianoAnderson, MD [email or web address] D: 12/25/2011 17:04:28 ET T: 12/25/2011 17:25:05 ET JOB#: [id number]  cc: Marya AmslerMarshall Marsh. Dareen PianoAnderson, MD, <Dictator> Lauro RegulusMARSHALL Marsh Kienan Doublin MD ELECTRONICALLY SIGNED 12/30/2011 19:53
